# Patient Record
Sex: Male | Born: 1980 | Race: White | Hispanic: No | Marital: Married | Smoking: Never smoker
Health system: Southern US, Community
[De-identification: ages and names within clinical notes are randomized; demographics above are authoritative.]

## PROBLEM LIST (undated history)

## (undated) DIAGNOSIS — N289 Disorder of kidney and ureter, unspecified: Secondary | ICD-10-CM

## (undated) DIAGNOSIS — Z992 Dependence on renal dialysis: Secondary | ICD-10-CM

## (undated) DIAGNOSIS — N186 End stage renal disease: Secondary | ICD-10-CM

## (undated) DIAGNOSIS — I10 Essential (primary) hypertension: Secondary | ICD-10-CM

## (undated) HISTORY — PX: GASTRIC BYPASS: SHX52

---

## 2004-04-05 ENCOUNTER — Ambulatory Visit: Payer: Self-pay | Admitting: Rheumatology

## 2004-04-25 ENCOUNTER — Ambulatory Visit: Payer: Self-pay | Admitting: Unknown Physician Specialty

## 2005-11-03 ENCOUNTER — Other Ambulatory Visit: Payer: Self-pay

## 2005-11-03 ENCOUNTER — Emergency Department: Payer: Self-pay | Admitting: Emergency Medicine

## 2005-11-07 ENCOUNTER — Ambulatory Visit: Payer: Self-pay | Admitting: Family Medicine

## 2005-11-08 ENCOUNTER — Ambulatory Visit: Payer: Self-pay | Admitting: Family Medicine

## 2005-11-27 ENCOUNTER — Ambulatory Visit: Payer: Self-pay | Admitting: Internal Medicine

## 2005-12-02 ENCOUNTER — Ambulatory Visit (HOSPITAL_BASED_OUTPATIENT_CLINIC_OR_DEPARTMENT_OTHER): Admission: RE | Admit: 2005-12-02 | Discharge: 2005-12-02 | Payer: Self-pay | Admitting: Internal Medicine

## 2005-12-08 ENCOUNTER — Ambulatory Visit: Payer: Self-pay | Admitting: Internal Medicine

## 2005-12-11 ENCOUNTER — Ambulatory Visit: Payer: Self-pay | Admitting: Internal Medicine

## 2005-12-19 ENCOUNTER — Ambulatory Visit: Payer: Self-pay | Admitting: Family Medicine

## 2006-01-26 ENCOUNTER — Emergency Department: Payer: Self-pay | Admitting: Emergency Medicine

## 2006-06-27 ENCOUNTER — Ambulatory Visit: Payer: Self-pay | Admitting: Internal Medicine

## 2006-10-28 ENCOUNTER — Emergency Department: Payer: Self-pay | Admitting: Emergency Medicine

## 2007-01-19 ENCOUNTER — Ambulatory Visit: Payer: Self-pay

## 2008-01-10 ENCOUNTER — Emergency Department: Payer: Self-pay | Admitting: Emergency Medicine

## 2009-01-11 ENCOUNTER — Inpatient Hospital Stay: Payer: Self-pay | Admitting: Internal Medicine

## 2009-10-27 ENCOUNTER — Ambulatory Visit: Payer: Self-pay

## 2010-05-04 ENCOUNTER — Emergency Department: Payer: Self-pay | Admitting: Emergency Medicine

## 2010-07-27 ENCOUNTER — Emergency Department: Payer: Self-pay | Admitting: Emergency Medicine

## 2010-08-26 ENCOUNTER — Emergency Department: Payer: Self-pay | Admitting: Internal Medicine

## 2010-08-27 ENCOUNTER — Emergency Department (HOSPITAL_COMMUNITY)
Admission: EM | Admit: 2010-08-27 | Discharge: 2010-08-27 | Disposition: A | Discharge status: Home / Self Care | Payer: Medicare HMO | Attending: Emergency Medicine | Admitting: Emergency Medicine

## 2010-08-27 DIAGNOSIS — M545 Low back pain, unspecified: Secondary | ICD-10-CM | POA: Insufficient documentation

## 2010-08-27 DIAGNOSIS — Z79899 Other long term (current) drug therapy: Secondary | ICD-10-CM | POA: Insufficient documentation

## 2010-09-04 ENCOUNTER — Emergency Department (HOSPITAL_COMMUNITY)
Admission: EM | Admit: 2010-09-04 | Discharge: 2010-09-04 | Disposition: A | Discharge status: Home / Self Care | Payer: Medicare HMO | Attending: Emergency Medicine | Admitting: Emergency Medicine

## 2010-09-04 ENCOUNTER — Emergency Department (HOSPITAL_COMMUNITY): Payer: Medicare HMO

## 2010-09-04 DIAGNOSIS — M25569 Pain in unspecified knee: Secondary | ICD-10-CM | POA: Insufficient documentation

## 2010-09-04 DIAGNOSIS — M1710 Unilateral primary osteoarthritis, unspecified knee: Secondary | ICD-10-CM | POA: Insufficient documentation

## 2010-09-04 DIAGNOSIS — M7989 Other specified soft tissue disorders: Secondary | ICD-10-CM | POA: Insufficient documentation

## 2010-09-04 DIAGNOSIS — M899 Disorder of bone, unspecified: Secondary | ICD-10-CM | POA: Insufficient documentation

## 2011-01-21 ENCOUNTER — Ambulatory Visit: Payer: Self-pay | Admitting: Rheumatology

## 2011-02-13 ENCOUNTER — Other Ambulatory Visit: Payer: Self-pay | Admitting: Nephrology

## 2011-02-14 ENCOUNTER — Observation Stay: Payer: Self-pay | Admitting: Nephrology

## 2012-06-09 ENCOUNTER — Emergency Department: Payer: Self-pay | Admitting: Emergency Medicine

## 2012-06-09 LAB — COMPREHENSIVE METABOLIC PANEL
Alkaline Phosphatase: 281 U/L — ABNORMAL HIGH (ref 50–136)
Anion Gap: 6 — ABNORMAL LOW (ref 7–16)
Bilirubin,Total: 0.3 mg/dL (ref 0.2–1.0)
Calcium, Total: 7.9 mg/dL — ABNORMAL LOW (ref 8.5–10.1)
Co2: 19 mmol/L — ABNORMAL LOW (ref 21–32)
Osmolality: 284 (ref 275–301)
SGOT(AST): 20 U/L (ref 15–37)
Sodium: 139 mmol/L (ref 136–145)
Total Protein: 7.6 g/dL (ref 6.4–8.2)

## 2012-06-09 LAB — CBC WITH DIFFERENTIAL/PLATELET
Basophil %: 0.4 %
Eosinophil %: 7 %
HGB: 11.8 g/dL — ABNORMAL LOW (ref 13.0–18.0)
Lymphocyte #: 1.1 10*3/uL (ref 1.0–3.6)
Lymphocyte %: 14.4 %
MCHC: 32.9 g/dL (ref 32.0–36.0)
Neutrophil #: 5.4 10*3/uL (ref 1.4–6.5)
RBC: 3.92 10*6/uL — ABNORMAL LOW (ref 4.40–5.90)

## 2012-06-09 LAB — URIC ACID: Uric Acid: 8.9 mg/dL — ABNORMAL HIGH (ref 3.5–7.2)

## 2012-06-09 LAB — SEDIMENTATION RATE: Erythrocyte Sed Rate: 47 mm/hr — ABNORMAL HIGH (ref 0–15)

## 2012-08-03 ENCOUNTER — Emergency Department: Payer: Self-pay | Admitting: Emergency Medicine

## 2014-06-12 NOTE — Op Note (Signed)
PATIENT NAME:  BENGIE, CORMICAN MR#:  [id number] DATE OF BIRTH:  15-Aug-1980  DATE OF PROCEDURE:  02/14/2011  PROCEDURE NOTE  SURGEON: Tama High, MD   REASON FOR PROCEDURE:  1. Acute renal failure.  2. Nephrotic range proteinuria.  3. Hematuria.   POSTOPERATIVE DIAGNOSES:   1. Acute renal failure.  2. Nephrotic range proteinuria.  3. Hematuria.   PROCEDURE PERFORMED: Percutaneous ultrasound-guided left renal biopsy.   DESCRIPTION OF PROCEDURE: After obtaining informed consent, the patient was brought down to the ultrasound suite. Subsequently, the left kidney was identified under ultrasound. Left flank was prepped and draped in standard sterile fashion. Local anesthesia was achieved using 1% lidocaine. Subsequently, using an 18-gauge biopsy device and ultrasound guidance, a total of four passes were made into the left kidney. The specimens were submitted to pathology and the pathologist deemed the specimens to be adequate. A very small perinephric hematoma was identified on postbiopsy images.   The patient tolerated the procedure very well. The patient will return to his room for continued observation.   ESTIMATED BLOOD LOSS: Minimal.        COMPLICATIONS: Small perinephric hematoma on postbiopsy images.  ____________________________ Tama High, MD mnl:bjt D: 02/14/2011 11:58:26 ET T: 02/14/2011 12:17:14 ET JOB#: [id number]  cc: Tama High, MD, <Dictator> Venetia Maxon. Elijio Miles, MD Tama High MD ELECTRONICALLY SIGNED 03/12/2011 22:10

## 2015-03-08 ENCOUNTER — Emergency Department
Admission: EM | Admit: 2015-03-08 | Discharge: 2015-03-09 | Disposition: A | Discharge status: Home / Self Care | Payer: Managed Care, Other (non HMO) | Attending: Emergency Medicine | Admitting: Emergency Medicine

## 2015-03-08 ENCOUNTER — Encounter: Payer: Self-pay | Admitting: Emergency Medicine

## 2015-03-08 ENCOUNTER — Emergency Department: Payer: Managed Care, Other (non HMO)

## 2015-03-08 DIAGNOSIS — R079 Chest pain, unspecified: Secondary | ICD-10-CM | POA: Diagnosis present

## 2015-03-08 DIAGNOSIS — J209 Acute bronchitis, unspecified: Secondary | ICD-10-CM

## 2015-03-08 HISTORY — DX: Disorder of kidney and ureter, unspecified: N28.9

## 2015-03-08 LAB — CBC
HEMATOCRIT: 29.4 % — AB (ref 40.0–52.0)
HEMOGLOBIN: 9.4 g/dL — AB (ref 13.0–18.0)
MCH: 27 pg (ref 26.0–34.0)
MCHC: 31.9 g/dL — AB (ref 32.0–36.0)
MCV: 84.8 fL (ref 80.0–100.0)
Platelets: 257 10*3/uL (ref 150–440)
RBC: 3.47 MIL/uL — AB (ref 4.40–5.90)
RDW: 16.1 % — ABNORMAL HIGH (ref 11.5–14.5)
WBC: 8.2 10*3/uL (ref 3.8–10.6)

## 2015-03-08 LAB — BASIC METABOLIC PANEL
ANION GAP: 6 (ref 5–15)
BUN: 59 mg/dL — ABNORMAL HIGH (ref 6–20)
CALCIUM: 7.1 mg/dL — AB (ref 8.9–10.3)
CHLORIDE: 108 mmol/L (ref 101–111)
CO2: 18 mmol/L — AB (ref 22–32)
Creatinine, Ser: 4.42 mg/dL — ABNORMAL HIGH (ref 0.61–1.24)
GFR calc Af Amer: 18 mL/min — ABNORMAL LOW (ref >60)
GFR calc non Af Amer: 16 mL/min — ABNORMAL LOW (ref >60)
GLUCOSE: 105 mg/dL — AB (ref 65–99)
POTASSIUM: 4.7 mmol/L (ref 3.5–5.1)
Sodium: 132 mmol/L — ABNORMAL LOW (ref 135–145)

## 2015-03-08 LAB — TROPONIN I: Troponin I: <0.03 ng/mL (ref <0.031)

## 2015-03-08 MED ORDER — LEVOFLOXACIN IN D5W 750 MG/150ML IV SOLN
750.0000 mg | Freq: Once | INTRAVENOUS | Status: AC
  Administered 2015-03-09: 750 mg via INTRAVENOUS
  Filled 2015-03-08: qty 150

## 2015-03-08 MED ORDER — SODIUM CHLORIDE 0.9 % IV BOLUS (SEPSIS)
1000.0000 mL | Freq: Once | INTRAVENOUS | Status: AC
  Administered 2015-03-09: 1000 mL via INTRAVENOUS

## 2015-03-08 NOTE — ED Notes (Signed)
Pt was at work at [employer] at [employer] today, when he suddenly began to have chest pressure in the center of his chest, that he describes as feeling as though an elephant was sitting on his chest.  Other symptoms include diaphoresis, nausea, lightheadedness, dizziness.  Episode lasted approximately 45 minutes and subsided after he laid down.

## 2015-03-08 NOTE — ED Notes (Signed)
Pt reports chills and "heavy" Cp starting today. Pt reports hx of kidney diease. Pt currently denies CP. Pt in no acute distress

## 2015-03-08 NOTE — ED Provider Notes (Signed)
[facility] Emergency Department Provider Note  ____________________________________________  Time seen: 11:45 PM  I have reviewed the triage vital signs and the nursing notes.   HISTORY  Chief Complaint Chest Pain and Fever     HPI Louis Lee is a 35 y.o. male presents with chills and generalized myalgias nausea and chest discomfort which subsided when the patient laid down. 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 Patient temperature on presentation was 100.4. Patient denies any cough     Past Medical History  Diagnosis Date  . Renal disorder     pt is listed for kidney transplant    There are no active problems to display for this patient.   Past Surgical History  Procedure Laterality Date  . Gastric bypass      No current outpatient prescriptions on file.  Allergies Review of patient's allergies indicates no known allergies.  No family history on file.  Social History Social History  Substance Use Topics  . Smoking status: Never Smoker   . Smokeless tobacco: None  . Alcohol Use: No    Review of Systems  Constitutional: Positive for chills and fever Eyes: Negative for visual changes. ENT: Negative for sore throat. Cardiovascular: Negative for chest pain. Respiratory: Negative for shortness of breath. Gastrointestinal: Negative for abdominal pain, vomiting and diarrhea. Genitourinary: Negative for dysuria. Musculoskeletal: Negative for back pain. Skin: Negative for rash. Neurological: Negative for headaches, focal weakness or numbness.   10-point ROS otherwise negative.  ____________________________________________   PHYSICAL EXAM:  VITAL SIGNS: ED Triage Vitals  Enc Vitals Group     BP 03/08/15 1902 140/77 mmHg     Pulse Rate 03/08/15 1902 96     Resp 03/08/15 1902 18     Temp 03/08/15 1902 100.4 F (38 C)     Temp Source 03/08/15 1902 Oral     SpO2 03/08/15 1902 100 %      Weight 03/08/15 1902 190 lb (86.183 kg)     Height 03/08/15 1902 5\' 3"  (1.6 m)     Head Cir --      Peak Flow --      Pain Score 03/08/15 1908 7     Pain Loc --      Pain Edu? --      Excl. in [place]? --      Constitutional: Alert and oriented. Well appearing and in no distress. Eyes: Conjunctivae are normal. PERRL. Normal extraocular movements. ENT   Head: Normocephalic and atraumatic.   Nose: No congestion/rhinnorhea.   Mouth/Throat: Mucous membranes are moist.   Neck: No stridor. Hematological/Lymphatic/Immunilogical: No cervical lymphadenopathy. Cardiovascular: Normal rate, regular rhythm. Normal and symmetric distal pulses are present in all extremities. No murmurs, rubs, or gallops. Respiratory: Normal respiratory effort without tachypnea nor retractions. Breath sounds are clear and equal bilaterally. No wheezes/rales/rhonchi. Gastrointestinal: Soft and nontender. No distention. There is no CVA tenderness. Genitourinary: deferred Musculoskeletal: Nontender with normal range of motion in all extremities. No joint effusions.  No lower extremity tenderness nor edema. Neurologic:  Normal speech and language. No gross focal neurologic deficits are appreciated. Speech is normal.  Skin:  Skin is warm, dry and intact. No rash noted. Psychiatric: Mood and affect are normal. Speech and behavior are normal. Patient exhibits appropriate insight and judgment.  ____________________________________________    LABS (pertinent positives/negatives)  Labs Reviewed  BASIC METABOLIC PANEL - Abnormal; Notable for the following:    Sodium 132 (*)  CO2 18 (*)    Glucose, Bld 105 (*)    BUN 59 (*)    Creatinine, Ser 4.42 (*)    Calcium 7.1 (*)    GFR calc non Af Amer 16 (*)    GFR calc Af Amer 18 (*)    All other components within normal limits  CBC - Abnormal; Notable for the following:    RBC 3.47 (*)    Hemoglobin 9.4 (*)    HCT 29.4 (*)    MCHC 31.9 (*)    RDW 16.1 (*)     All other components within normal limits  TROPONIN I     ____________________________________________   EKG  ED ECG REPORT I, Judas Mohammad, [place], the attending physician, personally viewed and interpreted this ECG.   Date: 03/08/2015  EKG Time: 6:57 PM  Rate: 98  Rhythm: Normal sinus rhythm  Axis: None  Intervals: Normal  ST&T Change: None   ____________________________________________    RADIOLOGY     DG Chest 2 View (Final result) Result time: 03/08/15 19:39:48   Final result by Rad Results In Interface (03/08/15 19:39:48)   Narrative:   CLINICAL DATA: 35 year old male with chest pressure and pain, diaphoresis, nausea, lightheadedness and dizziness.  EXAM: CHEST 2 VIEW  COMPARISON: Chest x-ray 01/11/2009.  FINDINGS: Mild diffuse peribronchial cuffing. Lung volumes are normal. No consolidative airspace disease. No pleural effusions. No pneumothorax. No pulmonary nodule or mass noted. Pulmonary vasculature and the cardiomediastinal silhouette are within normal limits.  IMPRESSION: 1. Mild diffuse peribronchial cuffing, which could be seen in the setting of an acute or chronic bronchitis.   Electronically Signed By: Vinnie Langton M.D. On: 03/08/2015 19:39          INITIAL IMPRESSION / ASSESSMENT AND PLAN / ED COURSE  Pertinent labs & imaging results that were available during my care of the patient were reviewed by me and considered in my medical decision making (see chart for details).  Given immunocompromise state patient received Levaquin for acute bronchitis and will be prescribed same at home.  ____________________________________________   FINAL CLINICAL IMPRESSION(S) / ED DIAGNOSES  Final diagnoses:  Acute bronchitis, unspecified organism      Gregor Hams, MD 03/09/15 [phone]

## 2015-03-09 LAB — TROPONIN I

## 2015-03-09 MED ORDER — LEVOFLOXACIN 500 MG PO TABS: 500.0000 mg | ORAL_TABLET | Freq: Every day | ORAL | Status: AC

## 2015-03-09 NOTE — Discharge Instructions (Signed)

## 2015-03-16 IMAGING — CR RIGHT HAND - COMPLETE 3+ VIEW
1 series · 3 of 3 positions shown · non-contrast
Comparison: none

REASON FOR EXAM: pain, swelling
COMMENTS:

PROCEDURE:     DXR - DXR HAND RT COMPLETE W/OBLIQUES  - June 09, 2012  [DATE]
RESULT:     Right hand images demonstrate old healed mid fifth metacarpal
fracture. No acute bony abnormality is evident. No severe degenerative
change is demonstrated.

[Series 1: pa · 0.17mm/px · 3 of 3 slices shown]
[im 1/3]
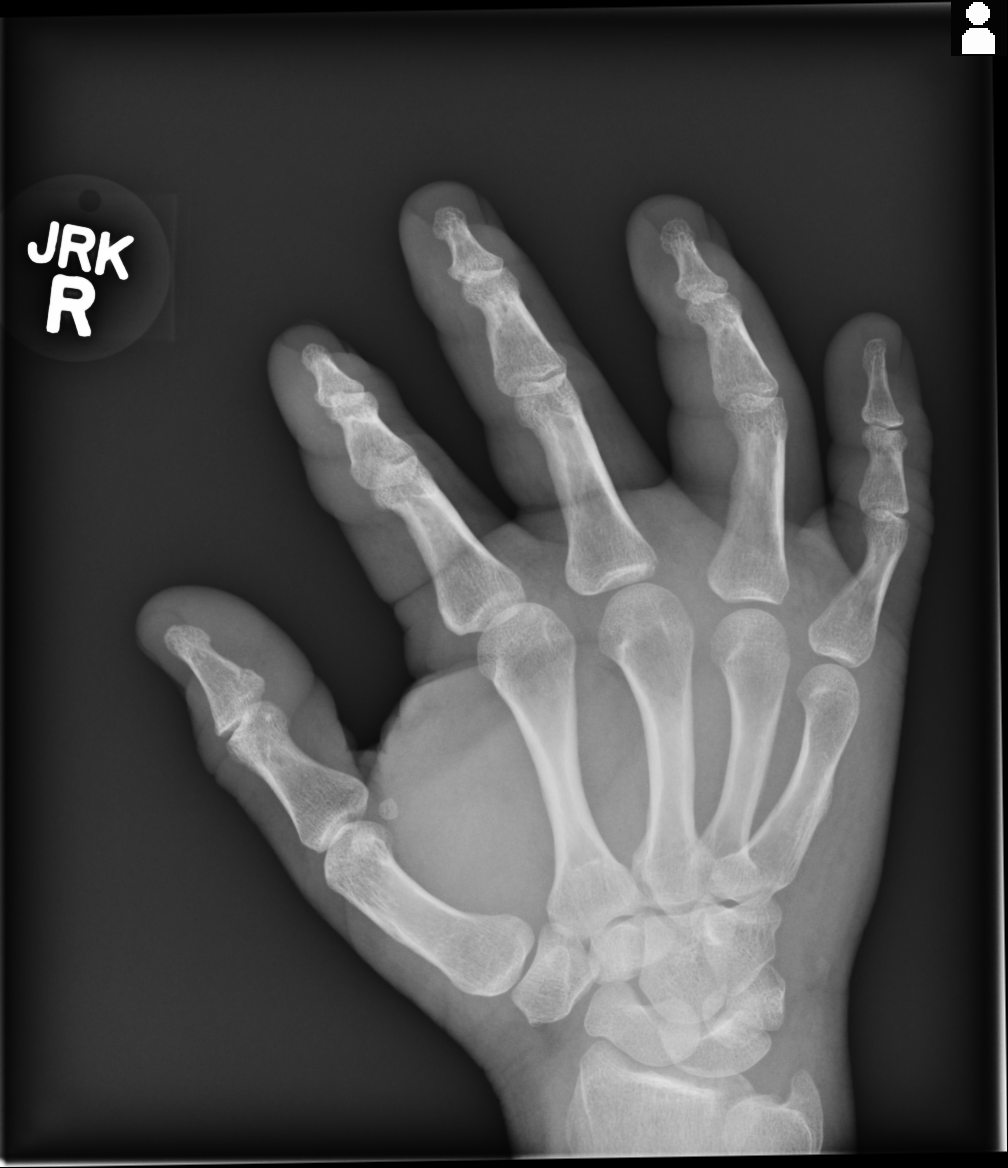
[im 2/3]
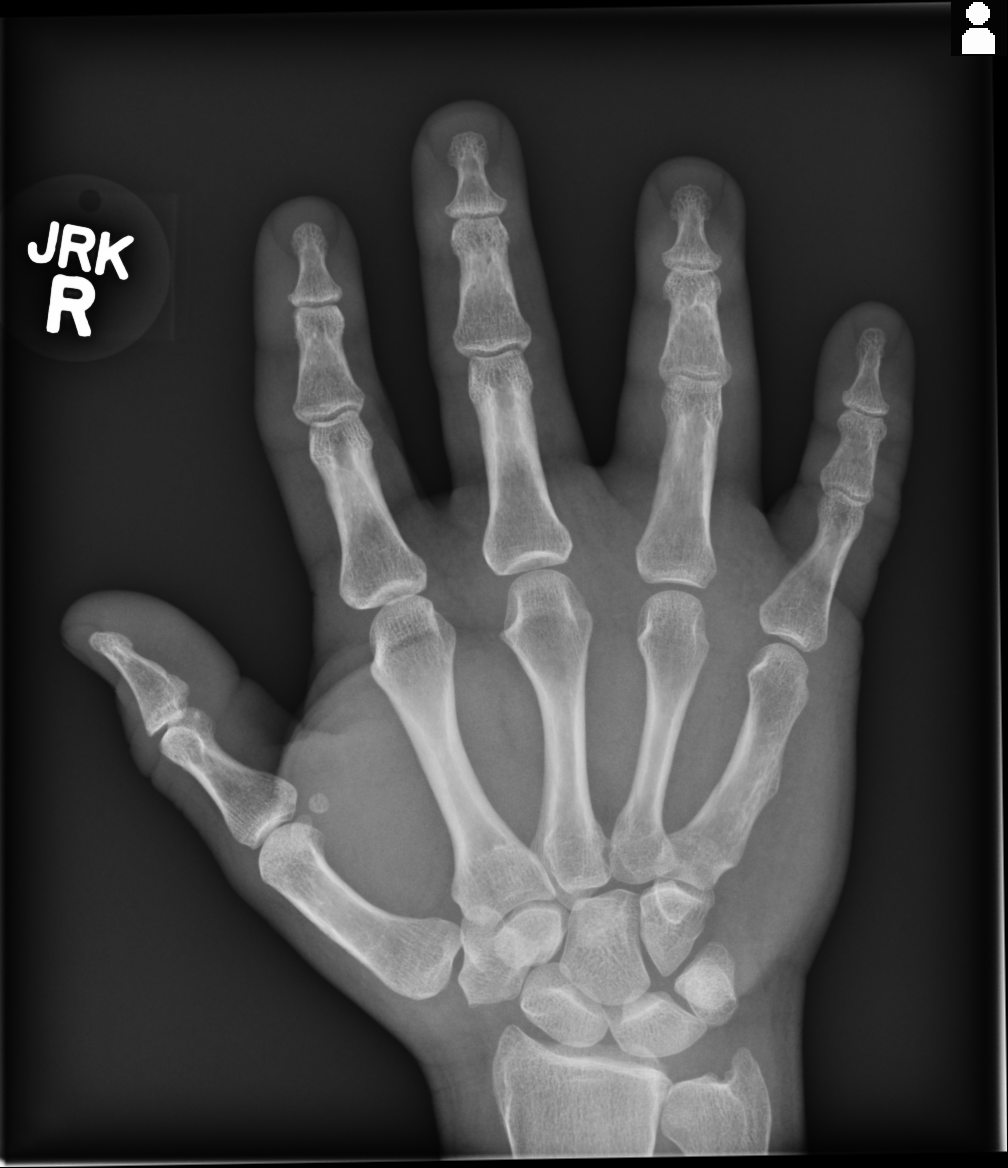
[im 3/3]
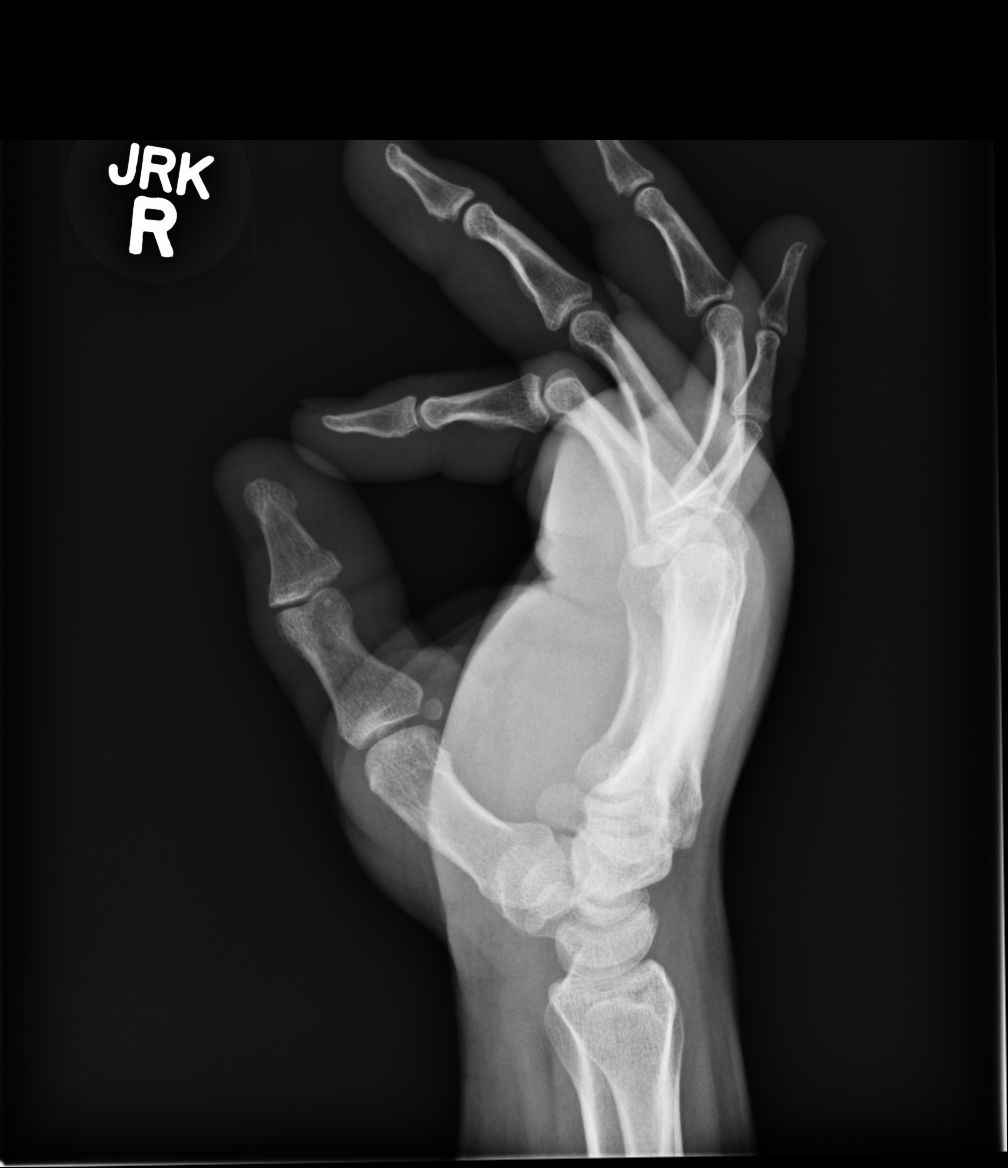

[3 of 3 positions shown; findings below may reference images not displayed]

IMPRESSION: 1. No acute bony abnormality. Irregular contour of the mid right fifth
metacarpal consistent with an old healed fracture. Correlate with history.

[REDACTED]

## 2016-01-29 ENCOUNTER — Encounter: Payer: Self-pay | Admitting: Emergency Medicine

## 2016-01-29 ENCOUNTER — Inpatient Hospital Stay
Admission: EM | Admit: 2016-01-29 | Discharge: 2016-01-31 | DRG: 193 | Disposition: A | Discharge status: Home / Self Care | Payer: Managed Care, Other (non HMO) | Attending: Internal Medicine | Admitting: Internal Medicine

## 2016-01-29 ENCOUNTER — Emergency Department: Payer: Managed Care, Other (non HMO)

## 2016-01-29 DIAGNOSIS — J209 Acute bronchitis, unspecified: Secondary | ICD-10-CM | POA: Diagnosis present

## 2016-01-29 DIAGNOSIS — N186 End stage renal disease: Secondary | ICD-10-CM | POA: Diagnosis present

## 2016-01-29 DIAGNOSIS — D649 Anemia, unspecified: Secondary | ICD-10-CM

## 2016-01-29 DIAGNOSIS — Z9884 Bariatric surgery status: Secondary | ICD-10-CM

## 2016-01-29 DIAGNOSIS — Z992 Dependence on renal dialysis: Secondary | ICD-10-CM | POA: Diagnosis not present

## 2016-01-29 DIAGNOSIS — E875 Hyperkalemia: Secondary | ICD-10-CM | POA: Diagnosis present

## 2016-01-29 DIAGNOSIS — Z7682 Awaiting organ transplant status: Secondary | ICD-10-CM | POA: Diagnosis not present

## 2016-01-29 DIAGNOSIS — J189 Pneumonia, unspecified organism: Secondary | ICD-10-CM | POA: Diagnosis not present

## 2016-01-29 DIAGNOSIS — Z8711 Personal history of peptic ulcer disease: Secondary | ICD-10-CM

## 2016-01-29 DIAGNOSIS — R000 Tachycardia, unspecified: Secondary | ICD-10-CM | POA: Diagnosis present

## 2016-01-29 DIAGNOSIS — I120 Hypertensive chronic kidney disease with stage 5 chronic kidney disease or end stage renal disease: Secondary | ICD-10-CM | POA: Diagnosis present

## 2016-01-29 DIAGNOSIS — R0603 Acute respiratory distress: Secondary | ICD-10-CM | POA: Diagnosis present

## 2016-01-29 DIAGNOSIS — R7989 Other specified abnormal findings of blood chemistry: Secondary | ICD-10-CM

## 2016-01-29 DIAGNOSIS — R778 Other specified abnormalities of plasma proteins: Secondary | ICD-10-CM

## 2016-01-29 DIAGNOSIS — D631 Anemia in chronic kidney disease: Secondary | ICD-10-CM | POA: Diagnosis present

## 2016-01-29 DIAGNOSIS — Z6834 Body mass index (BMI) 34.0-34.9, adult: Secondary | ICD-10-CM

## 2016-01-29 DIAGNOSIS — R0602 Shortness of breath: Secondary | ICD-10-CM | POA: Diagnosis present

## 2016-01-29 DIAGNOSIS — G4733 Obstructive sleep apnea (adult) (pediatric): Secondary | ICD-10-CM | POA: Diagnosis present

## 2016-01-29 LAB — COMPREHENSIVE METABOLIC PANEL
ALBUMIN: 2.8 g/dL — AB (ref 3.5–5.0)
ALT: 17 U/L (ref 17–63)
ANION GAP: 18 — AB (ref 5–15)
AST: 17 U/L (ref 15–41)
Alkaline Phosphatase: 75 U/L (ref 38–126)
BUN: 109 mg/dL — ABNORMAL HIGH (ref 6–20)
CHLORIDE: 101 mmol/L (ref 101–111)
CO2: 17 mmol/L — AB (ref 22–32)
Calcium: 8 mg/dL — ABNORMAL LOW (ref 8.9–10.3)
Creatinine, Ser: 15.17 mg/dL — ABNORMAL HIGH (ref 0.61–1.24)
GFR calc non Af Amer: 4 mL/min — ABNORMAL LOW (ref >60)
GFR, EST AFRICAN AMERICAN: 4 mL/min — AB (ref >60)
GLUCOSE: 87 mg/dL (ref 65–99)
POTASSIUM: 6.1 mmol/L — AB (ref 3.5–5.1)
SODIUM: 136 mmol/L (ref 135–145)
Total Bilirubin: 0.8 mg/dL (ref 0.3–1.2)
Total Protein: 7.1 g/dL (ref 6.5–8.1)

## 2016-01-29 LAB — CBC
HCT: 23.8 % — ABNORMAL LOW (ref 40.0–52.0)
HEMOGLOBIN: 7.9 g/dL — AB (ref 13.0–18.0)
MCH: 28.1 pg (ref 26.0–34.0)
MCHC: 33.1 g/dL (ref 32.0–36.0)
MCV: 84.8 fL (ref 80.0–100.0)
PLATELETS: 263 10*3/uL (ref 150–440)
RBC: 2.8 MIL/uL — ABNORMAL LOW (ref 4.40–5.90)
RDW: 18.8 % — AB (ref 11.5–14.5)
WBC: 7.4 10*3/uL (ref 3.8–10.6)

## 2016-01-29 LAB — BRAIN NATRIURETIC PEPTIDE: B NATRIURETIC PEPTIDE 5: 1283 pg/mL — AB (ref 0.0–100.0)

## 2016-01-29 LAB — INFLUENZA PANEL BY PCR (TYPE A & B)
INFLBPCR: NEGATIVE
Influenza A By PCR: NEGATIVE

## 2016-01-29 LAB — TROPONIN I: TROPONIN I: 0.12 ng/mL — AB (ref <0.03)

## 2016-01-29 MED ORDER — LEVOFLOXACIN IN D5W 750 MG/150ML IV SOLN
INTRAVENOUS | Status: AC
  Administered 2016-01-29: 750 mg via INTRAVENOUS
  Filled 2016-01-29: qty 150

## 2016-01-29 MED ORDER — HYDRALAZINE HCL 20 MG/ML IJ SOLN
5.0000 mg | Freq: Once | INTRAMUSCULAR | Status: AC
  Administered 2016-01-29: 5 mg via INTRAVENOUS
  Filled 2016-01-29: qty 1

## 2016-01-29 MED ORDER — HEPARIN 1000 UNIT/ML FOR PERITONEAL DIALYSIS
500.0000 [IU] | INTRAMUSCULAR | Status: DC | PRN
  Filled 2016-01-29: qty 0.5

## 2016-01-29 MED ORDER — IPRATROPIUM-ALBUTEROL 0.5-2.5 (3) MG/3ML IN SOLN
3.0000 mL | Freq: Once | RESPIRATORY_TRACT | Status: AC
  Administered 2016-01-29: 3 mL via RESPIRATORY_TRACT
  Filled 2016-01-29 (×2): qty 3

## 2016-01-29 MED ORDER — DELFLEX-LC/2.5% DEXTROSE 394 MOSM/L IP SOLN
INTRAPERITONEAL | Status: DC
  Administered 2016-01-30: 01:00:00 via INTRAPERITONEAL
  Filled 2016-01-29 (×2): qty 3000

## 2016-01-29 MED ORDER — HYDROCOD POLST-CPM POLST ER 10-8 MG/5ML PO SUER
5.0000 mL | Freq: Once | ORAL | Status: AC
  Administered 2016-01-29: 5 mL via ORAL

## 2016-01-29 MED ORDER — ASPIRIN 81 MG PO CHEW
324.0000 mg | CHEWABLE_TABLET | Freq: Once | ORAL | Status: AC
  Administered 2016-01-29: 324 mg via ORAL

## 2016-01-29 MED ORDER — HYDROCOD POLST-CPM POLST ER 10-8 MG/5ML PO SUER
ORAL | Status: AC
  Administered 2016-01-29: 5 mL via ORAL
  Filled 2016-01-29: qty 5

## 2016-01-29 MED ORDER — GENTAMICIN SULFATE 0.1 % EX CREA
1.0000 "application " | TOPICAL_CREAM | Freq: Every day | CUTANEOUS | Status: DC
  Administered 2016-01-30: 1 via TOPICAL
  Filled 2016-01-29: qty 15

## 2016-01-29 MED ORDER — ASPIRIN 81 MG PO CHEW
CHEWABLE_TABLET | ORAL | Status: AC
  Administered 2016-01-29: 324 mg via ORAL
  Filled 2016-01-29: qty 4

## 2016-01-29 MED ORDER — LEVOFLOXACIN IN D5W 750 MG/150ML IV SOLN
750.0000 mg | Freq: Once | INTRAVENOUS | Status: AC
  Administered 2016-01-29: 750 mg via INTRAVENOUS
  Filled 2016-01-29: qty 150

## 2016-01-29 NOTE — ED Provider Notes (Addendum)
[facility] Emergency Department Provider Note        Time seen: ----------------------------------------- 6:27 PM on 01/29/2016 -----------------------------------------    I have reviewed the triage vital signs and the nursing notes.   HISTORY  Chief Complaint Generalized Body Aches and Nasal Congestion    HPI Louis Lee is a 35 y.o. male who presents to ER for body aches, chills, congestion and cough. Patient states symptoms started on Saturday and he has sore throat as well after coughing. Patient is on peritoneal dialysis and kidney transplant list.Patient states he is coughing up brown sputum, nothing makes his symptoms better.   Past Medical History:  Diagnosis Date  . Renal disorder    pt is listed for kidney transplant    There are no active problems to display for this patient.   Past Surgical History:  Procedure Laterality Date  . GASTRIC BYPASS      Allergies Patient has no known allergies.  Social History Social History  Substance Use Topics  . Smoking status: Never Smoker  . Smokeless tobacco: Never Used  . Alcohol use No    Review of Systems Constitutional: Positive for aches and chills, sore throat Cardiovascular: Negative for chest pain. Respiratory: Negative for shortness of breath.Positive for cough Gastrointestinal: Negative for abdominal pain, vomiting and diarrhea. Genitourinary: Negative for dysuria. Musculoskeletal: Negative for back pain. Skin: Negative for rash. Neurological: Negative for headaches, focal weakness or numbness.  10-point ROS otherwise negative.  ____________________________________________   PHYSICAL EXAM:  VITAL SIGNS: ED Triage Vitals  Enc Vitals Group     BP 01/29/16 1748 (!) 187/117     Pulse Rate 01/29/16 1748 81     Resp 01/29/16 1748 18     Temp 01/29/16 1748 97.7 F (36.5 C)     Temp Source 01/29/16 1748 Oral     SpO2 01/29/16 1748 96 %     Weight 01/29/16 1749 209  lb (94.8 kg)     Height 01/29/16 1749 5\' 4"  (1.626 m)     Head Circumference --      Peak Flow --      Pain Score 01/29/16 1748 5     Pain Loc --      Pain Edu? --      Excl. in [place]? --     Constitutional: Alert and oriented. Chronically ill-appearing, in no distress Eyes: Conjunctivae are pale PERRL. Normal extraocular movements. ENT   Head: Normocephalic and atraumatic.   Nose: No congestion/rhinnorhea.   Mouth/Throat: Mucous membranes are moist.   Neck: No stridor. Cardiovascular: Normal rate, regular rhythm. No murmurs, rubs, or gallops. Respiratory: Rhonchi in the right base, otherwise clear Gastrointestinal: Soft and nontender. Normal bowel sounds Musculoskeletal: Nontender with normal range of motion in all extremities. No lower extremity tenderness nor edema. Neurologic:  Normal speech and language. No gross focal neurologic deficits are appreciated.  Skin:  Skin is warm, dry and intact. No rash noted. Psychiatric: Mood and affect are normal. Speech and behavior are normal.  ____________________________________________  EKG: Interpreted by me.Sinus rhythm rate of 92 bpm, normal PR interval, normal QRS, normal QT, normal axis. Non specific T-wave changes  ____________________________________________  ED COURSE:  Pertinent labs & imaging results that were available during my care of the patient were reviewed by me and considered in my medical decision making (see chart for details). Clinical Course as of Jan 31 1450  Mon Jan 29, 2016  2012 Patient was discussed with both nephrology and hospitalist  service.  [JW]    Clinical Course User Index [JW] Earleen Newport, MD  Patient is in no distress, we will assess with labs and imaging. He'll receive a DuoNeb and Tussionex as well.  Procedures ____________________________________________   LABS (pertinent positives/negatives)  Labs Reviewed  CBC - Abnormal; Notable for the following:       Result Value    RBC 2.80 (*)    Hemoglobin 7.9 (*)    HCT 23.8 (*)    RDW 18.8 (*)    All other components within normal limits  COMPREHENSIVE METABOLIC PANEL - Abnormal; Notable for the following:    Potassium 6.1 (*)    CO2 17 (*)    BUN 109 (*)    Creatinine, Ser 15.17 (*)    Calcium 8.0 (*)    Albumin 2.8 (*)    GFR calc non Af Amer 4 (*)    GFR calc Af Amer 4 (*)    Anion gap 18 (*)    All other components within normal limits  TROPONIN I - Abnormal; Notable for the following:    Troponin I 0.12 (*)    All other components within normal limits  CULTURE, BLOOD (ROUTINE X 2)  CULTURE, BLOOD (ROUTINE X 2)  INFLUENZA PANEL BY PCR (TYPE A & B, H1N1)    RADIOLOGY Images were viewed by me  Chest x-ray IMPRESSION: Patchy infiltrates bilaterally slightly worse on the right than the left. ____________________________________________  FINAL ASSESSMENT AND PLAN  Cough, Pneumonia, chronic kidney disease, elevated troponin, hyperkalemia  Plan: Patient with labs and imaging as dictated above. Patient presents to ER with symptoms of influenza or pneumonia. X-ray findings confirmed pneumonia. Patient has instructed that he is chronically hyperkalemic but that it improves rapidly with peritoneal dialysis. His troponin is elevated which is unusual for him, we will give him aspirin and this will need to be repeated. His pneumonia will be treated with IV Levaquin. I will discuss with the hospitalist for admission.   Earleen Newport, MD   Note: This dictation was prepared with Dragon dictation. Any transcriptional errors that result from this process are unintentional    Earleen Newport, MD 01/29/16 2002    Earleen Newport, MD 02/01/16 [phone]

## 2016-01-29 NOTE — ED Triage Notes (Addendum)
Patient presents to the ED with body aches, chills, nasal congestion, and cough.  Patient states symptoms started on Saturday.  Patient states, "my throat hurts sometimes after I cough a lot."  Denies any other family members with similar symptoms.  Patient is on peritoneal dialysis.  Patient is on kidney transplant list.

## 2016-01-29 NOTE — Progress Notes (Signed)
Orders placed for [facility] at 2041pm. Awaiting patient arrival to room so that he can be started on [facility]. Please call dialysis unit when arrives.

## 2016-01-29 NOTE — ED Notes (Signed)
Patient transported to X-ray 

## 2016-01-29 NOTE — ED Notes (Signed)
Pt reports has not been able to take blood pressure medicine for the past 2 days.

## 2016-01-29 NOTE — H&P (Signed)
[place] [facility] [place] NAME: Louis Lee    MR#:  [id number]  DATE OF BIRTH:  Jun 24, 1980  DATE OF ADMISSION:  01/29/2016  PRIMARY CARE PHYSICIAN: [facility]   REQUESTING/REFERRING PHYSICIAN: Dr. Jimmye Norman  CHIEF COMPLAINT:  Shortness of breath cough congestion with productive phlegm brownish sputum  HISTORY OF PRESENT ILLNESS:  Louis Lee  is a 35 y.o. male with a known history of End-stage renal disease on peritoneal dialysis, hypertension, history of gastric bypass, comes to the emergency room with increasing shortness of breath cough congestion. Patient states his symptoms started with URI and lasts 1-2 days has been coughing up brownish phlegm. Denies any chest pain. Has been having some shortness of breath more so with exertion. In the emergency room his been afebrile. Sats were in the upper 90s. Mildly tachycardic. Chest x-ray shows bilateral infiltrate worse his edema. Received 1 dose of IV Levaquin. She was also noted to have potassium elevated to 6 without any EKG changes. He's been admitted with acute respiratory failure secondary to shortness of breath/pulmonary edema worse his pneumonia.  PAST MEDICAL HISTORY:   Past Medical History:  Diagnosis Date  . Renal disorder    pt is listed for kidney transplant    PAST SURGICAL HISTOIRY:   Past Surgical History:  Procedure Laterality Date  . GASTRIC BYPASS      SOCIAL HISTORY:   Social History  Substance Use Topics  . Smoking status: Never Smoker  . Smokeless tobacco: Never Used  . Alcohol use No    FAMILY HISTORY:  No family history on file.  DRUG ALLERGIES:  No Known Allergies  REVIEW OF SYSTEMS:  Review of Systems  Constitutional: Positive for malaise/fatigue. Negative for chills, fever and weight loss.  HENT: Positive for congestion and sore throat. Negative for ear discharge, ear pain and nosebleeds.   Eyes: Negative for blurred vision, pain  and discharge.  Respiratory: Positive for cough, sputum production and shortness of breath. Negative for wheezing and stridor.   Cardiovascular: Negative for chest pain, palpitations, orthopnea and PND.  Gastrointestinal: Negative for abdominal pain, diarrhea, nausea and vomiting.  Genitourinary: Negative for frequency and urgency.  Musculoskeletal: Negative for back pain and joint pain.  Neurological: Positive for weakness. Negative for sensory change, speech change and focal weakness.  Psychiatric/Behavioral: Negative for depression and hallucinations. The patient is not nervous/anxious.      MEDICATIONS AT HOME:   Prior to Admission medications   Medication Sig Start Date End Date Taking? Authorizing Provider  albuterol (PROVENTIL HFA;VENTOLIN HFA) 108 (90 Base) MCG/ACT inhaler Inhale 2 puffs into the lungs every 6 (six) hours as needed. 03/26/13  Yes Historical Provider, MD  amLODipine (NORVASC) 5 MG tablet Take 1 tablet by mouth daily. 12/18/15  Yes Historical Provider, MD  calcium acetate (PHOSLO) 667 MG capsule Take 1,334 mg by mouth 3 (three) times daily with meals. Take 2 tablets (1,'337mg'$ ) with each meal and between meals for total of 3 times per day. 12/21/15  Yes Historical Provider, MD  furosemide (LASIX) 40 MG tablet Take 1 tablet by mouth daily. 10/18/15  Yes Historical Provider, MD  losartan (COZAAR) 100 MG tablet Take 100 mg by mouth daily. 01/03/16 01/02/17 Yes Historical Provider, MD  Oxycodone HCl 10 MG TABS Take 10 mg by mouth every 8 (eight) hours as needed. 06/28/15  Yes Historical Provider, MD  sodium bicarbonate 650 MG tablet Take 650 mg by mouth 2 (two) times daily. 12/06/15  Yes Historical Provider, MD  Vitamin D, Ergocalciferol, (DRISDOL) 50000 units CAPS capsule Take 1 capsule by mouth once a week. wed 12/18/15  Yes Historical Provider, MD  febuxostat (ULORIC) 40 MG tablet Take 1 tablet by mouth daily. 07/20/15   Historical Provider, MD      VITAL SIGNS:  Blood  pressure (!) 178/114, pulse 81, temperature 97.7 F (36.5 C), temperature source Oral, resp. rate 18, height '5\' 4"'$  (1.626 m), weight 94.8 kg (209 lb), SpO2 96 %.  PHYSICAL EXAMINATION:  GENERAL:  35 y.o.-year-old patient lying in the bed with no acute distress.  EYES: Pupils equal, round, reactive to light and accommodation. No scleral icterus. Extraocular muscles intact.  HEENT: Head atraumatic, normocephalic. Oropharynx and nasopharynx clear.  NECK:  Supple, no jugular venous distention. No thyroid enlargement, no tenderness.  LUNGS: Normal breath sounds bilaterally, no wheezing, rales,rhonchi or crepitation. No use of accessory muscles of respiration.  CARDIOVASCULAR: S1, S2 normal. No murmurs, rubs, or gallops.  ABDOMEN: Soft, nontender, nondistended. Bowel sounds present. No organomegaly or mass.  EXTREMITIES: No pedal edema, cyanosis, or clubbing.  NEUROLOGIC: Cranial nerves II through XII are intact. Muscle strength 5/5 in all extremities. Sensation intact. Gait not checked.  PSYCHIATRIC: The patient is alert and oriented x 3.  SKIN: No obvious rash, lesion, or ulcer.   LABORATORY PANEL:   CBC  Recent Labs Lab 01/29/16 1801  WBC 7.4  HGB 7.9*  HCT 23.8*  PLT 263   ------------------------------------------------------------------------------------------------------------------  Chemistries   Recent Labs Lab 01/29/16 1801  NA 136  K 6.1*  CL 101  CO2 17*  GLUCOSE 87  BUN 109*  CREATININE 15.17*  CALCIUM 8.0*  AST 17  ALT 17  ALKPHOS 75  BILITOT 0.8   ------------------------------------------------------------------------------------------------------------------  Cardiac Enzymes  Recent Labs Lab 01/29/16 1801  TROPONINI 0.12*   ------------------------------------------------------------------------------------------------------------------  RADIOLOGY:  Dg Chest 2 View  Result Date: 01/29/2016 CLINICAL DATA:  Cough and chills for 2 days EXAM:  CHEST  2 VIEW COMPARISON:  03/08/2015 FINDINGS: Cardiac shadow is at the upper limits of normal in size. The lungs are well aerated bilaterally and demonstrate some patchy infiltrative changes bilaterally. No sizable effusion is seen. No acute bony abnormality is noted. IMPRESSION: Patchy infiltrates bilaterally slightly worse on the right than the left. Electronically Signed   By: Inez Catalina M.D.   On: 01/29/2016 18:54    EKG:   Normal sinus rhythm. No peak T waves. IMPRESSION AND PLAN:   Louis Lee  is a 35 y.o. male with a known history of End-stage renal disease on peritoneal dialysis, hypertension, history of gastric bypass, comes to the emergency room with increasing shortness of breath cough congestion. Patient states his symptoms started with URI and lasts 1-2 days has been coughing up brownish phlegm. Denies any chest pain. Has been having some shortness of breath more so with exertion.  1.Acute respiratory distress with shortness of breath URI congestion with possible bilateral interstitial edema versus pneumonia -Admit to medical floor -Oral Levaquin -When necessary nebs and oral inhalers -Patient sats are 96% on room air currently  2. End stage renal disease on peritoneal dialysis -Patient's peritoneal dialysis orders have been given by dr Candiss Norse -Potassium elevated at 6 -Repeat met B in the morning  3. Malignant hypertension  Resume home meds  4. DVT prophylaxis subcutaneous heparin  No family members present discussed with nephrology  All the records are reviewed and case discussed with ED provider. Management plans discussed with the patient,  family and they are in agreement.  CODE STATUS: full  TOTAL TIME TAKING CARE OF THIS PATIENT:50 minutes.    Louis Lee M.D on 01/29/2016 at 9:12 PM  Between 7am to 6pm - Pager - [phone]  After 6pm go to [email or web address] - password [email or web address] [place] [facility]  Office  [phone]  CC: Primary care  physician; [facility]

## 2016-01-29 NOTE — ED Notes (Signed)
Pt transferred to room 13 with family; awaiting inpatient room assignment

## 2016-01-30 LAB — PROTIME-INR
INR: 1.19
Prothrombin Time: 15.2 seconds (ref 11.4–15.2)

## 2016-01-30 LAB — BASIC METABOLIC PANEL
ANION GAP: 17 — AB (ref 5–15)
BUN: 113 mg/dL — ABNORMAL HIGH (ref 6–20)
CALCIUM: 8 mg/dL — AB (ref 8.9–10.3)
CHLORIDE: 101 mmol/L (ref 101–111)
CO2: 17 mmol/L — AB (ref 22–32)
Creatinine, Ser: 14.86 mg/dL — ABNORMAL HIGH (ref 0.61–1.24)
GFR calc non Af Amer: 4 mL/min — ABNORMAL LOW (ref >60)
GFR, EST AFRICAN AMERICAN: 4 mL/min — AB (ref >60)
GLUCOSE: 102 mg/dL — AB (ref 65–99)
POTASSIUM: 5.3 mmol/L — AB (ref 3.5–5.1)
Sodium: 135 mmol/L (ref 135–145)

## 2016-01-30 LAB — CBC
HEMATOCRIT: 24 % — AB (ref 40.0–52.0)
Hemoglobin: 7.9 g/dL — ABNORMAL LOW (ref 13.0–18.0)
MCH: 27.9 pg (ref 26.0–34.0)
MCHC: 32.8 g/dL (ref 32.0–36.0)
MCV: 84.9 fL (ref 80.0–100.0)
Platelets: 288 10*3/uL (ref 150–440)
RBC: 2.82 MIL/uL — AB (ref 4.40–5.90)
RDW: 18.8 % — ABNORMAL HIGH (ref 11.5–14.5)
WBC: 5.7 10*3/uL (ref 3.8–10.6)

## 2016-01-30 LAB — APTT: aPTT: 32 seconds (ref 24–36)

## 2016-01-30 LAB — TROPONIN I
TROPONIN I: 0.12 ng/mL — AB (ref <0.03)
TROPONIN I: 0.11 ng/mL — AB (ref <0.03)
Troponin I: 0.13 ng/mL (ref <0.03)

## 2016-01-30 LAB — PROCALCITONIN: Procalcitonin: 0.36 ng/mL

## 2016-01-30 LAB — HEPARIN LEVEL (UNFRACTIONATED): Heparin Unfractionated: <0.1 IU/mL — ABNORMAL LOW (ref 0.30–0.70)

## 2016-01-30 MED ORDER — FEBUXOSTAT 40 MG PO TABS
40.0000 mg | ORAL_TABLET | Freq: Every day | ORAL | Status: DC
  Administered 2016-01-30 – 2016-01-31 (×2): 40 mg via ORAL
  Filled 2016-01-30 (×2): qty 1

## 2016-01-30 MED ORDER — HYDRALAZINE HCL 20 MG/ML IJ SOLN
10.0000 mg | INTRAMUSCULAR | Status: DC | PRN
  Administered 2016-01-30 – 2016-01-31 (×3): 10 mg via INTRAVENOUS
  Filled 2016-01-30 (×3): qty 1

## 2016-01-30 MED ORDER — FUROSEMIDE 40 MG PO TABS
40.0000 mg | ORAL_TABLET | Freq: Every day | ORAL | Status: DC
  Administered 2016-01-30 – 2016-01-31 (×2): 40 mg via ORAL
  Filled 2016-01-30 (×2): qty 1

## 2016-01-30 MED ORDER — METOPROLOL TARTRATE 5 MG/5ML IV SOLN
5.0000 mg | Freq: Once | INTRAVENOUS | Status: AC
  Administered 2016-01-30: 5 mg via INTRAVENOUS
  Filled 2016-01-30: qty 5

## 2016-01-30 MED ORDER — OXYCODONE HCL 5 MG PO TABS
10.0000 mg | ORAL_TABLET | Freq: Four times a day (QID) | ORAL | Status: DC | PRN
  Administered 2016-01-30 – 2016-01-31 (×3): 10 mg via ORAL
  Filled 2016-01-30 (×3): qty 2

## 2016-01-30 MED ORDER — CALCIUM ACETATE (PHOS BINDER) 667 MG PO CAPS
1334.0000 mg | ORAL_CAPSULE | Freq: Three times a day (TID) | ORAL | Status: DC
  Administered 2016-01-30 – 2016-01-31 (×6): 1334 mg via ORAL
  Filled 2016-01-30 (×5): qty 2

## 2016-01-30 MED ORDER — OXYCODONE HCL 5 MG PO TABS
10.0000 mg | ORAL_TABLET | Freq: Three times a day (TID) | ORAL | Status: DC | PRN
  Administered 2016-01-30 (×2): 10 mg via ORAL
  Filled 2016-01-30 (×2): qty 2

## 2016-01-30 MED ORDER — HEPARIN 1000 UNIT/ML FOR PERITONEAL DIALYSIS
500.0000 [IU] | INTRAMUSCULAR | Status: DC | PRN
  Filled 2016-01-30: qty 0.5

## 2016-01-30 MED ORDER — VITAMIN D (ERGOCALCIFEROL) 1.25 MG (50000 UNIT) PO CAPS
50000.0000 [IU] | ORAL_CAPSULE | ORAL | Status: DC
  Administered 2016-01-31: 50000 [IU] via ORAL
  Filled 2016-01-30: qty 1

## 2016-01-30 MED ORDER — LEVOFLOXACIN 500 MG PO TABS
250.0000 mg | ORAL_TABLET | Freq: Every day | ORAL | Status: DC
  Administered 2016-01-31: 250 mg via ORAL
  Filled 2016-01-30: qty 1

## 2016-01-30 MED ORDER — HEPARIN BOLUS VIA INFUSION
4000.0000 [IU] | Freq: Once | INTRAVENOUS | Status: AC
  Administered 2016-01-30: 4000 [IU] via INTRAVENOUS
  Filled 2016-01-30: qty 4000

## 2016-01-30 MED ORDER — HEPARIN (PORCINE) IN NACL 100-0.45 UNIT/ML-% IJ SOLN
1270.0000 [IU]/h | INTRAMUSCULAR | Status: DC
  Administered 2016-01-30: 950 [IU]/h via INTRAVENOUS
  Filled 2016-01-30: qty 250

## 2016-01-30 MED ORDER — GUAIFENESIN-DM 100-10 MG/5ML PO SYRP
5.0000 mL | ORAL_SOLUTION | ORAL | Status: DC | PRN
  Administered 2016-01-30 (×2): 5 mL via ORAL
  Filled 2016-01-30 (×2): qty 5

## 2016-01-30 MED ORDER — HEPARIN SODIUM (PORCINE) 5000 UNIT/ML IJ SOLN
5000.0000 [IU] | Freq: Three times a day (TID) | INTRAMUSCULAR | Status: DC
  Administered 2016-01-30 – 2016-01-31 (×3): 5000 [IU] via SUBCUTANEOUS
  Filled 2016-01-30 (×2): qty 1

## 2016-01-30 MED ORDER — ONDANSETRON HCL 4 MG/2ML IJ SOLN
4.0000 mg | Freq: Four times a day (QID) | INTRAMUSCULAR | Status: DC | PRN
  Administered 2016-01-30 – 2016-01-31 (×3): 4 mg via INTRAVENOUS
  Filled 2016-01-30 (×3): qty 2

## 2016-01-30 MED ORDER — HEPARIN BOLUS VIA INFUSION
2400.0000 [IU] | Freq: Once | INTRAVENOUS | Status: DC
  Filled 2016-01-30: qty 2400

## 2016-01-30 MED ORDER — FUROSEMIDE 10 MG/ML IJ SOLN
40.0000 mg | Freq: Once | INTRAMUSCULAR | Status: AC
  Administered 2016-01-30: 40 mg via INTRAVENOUS
  Filled 2016-01-30: qty 4

## 2016-01-30 MED ORDER — EPOETIN ALFA 20000 UNIT/ML IJ SOLN
20000.0000 [IU] | Freq: Once | INTRAMUSCULAR | Status: AC
  Administered 2016-01-31: 20000 [IU] via SUBCUTANEOUS
  Filled 2016-01-30 (×3): qty 1

## 2016-01-30 MED ORDER — SODIUM BICARBONATE 650 MG PO TABS
650.0000 mg | ORAL_TABLET | Freq: Two times a day (BID) | ORAL | Status: DC
  Administered 2016-01-30 – 2016-01-31 (×4): 650 mg via ORAL
  Filled 2016-01-30 (×4): qty 1

## 2016-01-30 MED ORDER — DELFLEX-LC/1.5% DEXTROSE 344 MOSM/L IP SOLN
2000.0000 mL | INTRAPERITONEAL | Status: DC
  Administered 2016-01-30: 2000 mL via INTRAPERITONEAL
  Filled 2016-01-30: qty 3000

## 2016-01-30 MED ORDER — ACETAMINOPHEN 325 MG PO TABS
650.0000 mg | ORAL_TABLET | Freq: Four times a day (QID) | ORAL | Status: DC | PRN
  Administered 2016-01-30 – 2016-01-31 (×4): 650 mg via ORAL
  Filled 2016-01-30 (×5): qty 2

## 2016-01-30 MED ORDER — AMLODIPINE BESYLATE 5 MG PO TABS
5.0000 mg | ORAL_TABLET | Freq: Every day | ORAL | Status: DC
  Administered 2016-01-30 – 2016-01-31 (×2): 5 mg via ORAL
  Filled 2016-01-30 (×2): qty 1

## 2016-01-30 MED ORDER — MORPHINE SULFATE (PF) 4 MG/ML IV SOLN
1.0000 mg | Freq: Once | INTRAVENOUS | Status: AC
  Administered 2016-01-30: 1 mg via INTRAVENOUS
  Filled 2016-01-30: qty 1

## 2016-01-30 MED ORDER — ALBUTEROL SULFATE (2.5 MG/3ML) 0.083% IN NEBU
3.0000 mL | INHALATION_SOLUTION | Freq: Four times a day (QID) | RESPIRATORY_TRACT | Status: DC | PRN
  Administered 2016-01-30 (×2): 3 mL via RESPIRATORY_TRACT
  Filled 2016-01-30 (×3): qty 3

## 2016-01-30 MED ORDER — ONDANSETRON HCL 4 MG PO TABS: 4.0000 mg | ORAL_TABLET | Freq: Four times a day (QID) | ORAL | Status: DC | PRN

## 2016-01-30 MED ORDER — LOSARTAN POTASSIUM 50 MG PO TABS
100.0000 mg | ORAL_TABLET | Freq: Every day | ORAL | Status: DC
  Administered 2016-01-30 – 2016-01-31 (×2): 100 mg via ORAL
  Filled 2016-01-30 (×2): qty 2

## 2016-01-30 MED ORDER — ACETAMINOPHEN 650 MG RE SUPP: 650.0000 mg | Freq: Four times a day (QID) | RECTAL | Status: DC | PRN

## 2016-01-30 MED ORDER — HEPARIN SODIUM (PORCINE) 5000 UNIT/ML IJ SOLN
5000.0000 [IU] | Freq: Three times a day (TID) | INTRAMUSCULAR | Status: DC
  Administered 2016-01-30: 5000 [IU] via SUBCUTANEOUS
  Filled 2016-01-30 (×2): qty 1

## 2016-01-30 MED ORDER — METOPROLOL TARTRATE 5 MG/5ML IV SOLN
5.0000 mg | Freq: Once | INTRAVENOUS | Status: AC
  Administered 2016-01-30: 5 mg via INTRAVENOUS
  Filled 2016-01-30 (×2): qty 5

## 2016-01-30 MED ORDER — ATORVASTATIN CALCIUM 20 MG PO TABS
80.0000 mg | ORAL_TABLET | Freq: Once | ORAL | Status: AC
  Administered 2016-01-30: 80 mg via ORAL
  Filled 2016-01-30: qty 4

## 2016-01-30 MED ORDER — GENTAMICIN SULFATE 0.1 % EX CREA
1.0000 "application " | TOPICAL_CREAM | Freq: Every day | CUTANEOUS | Status: DC
  Administered 2016-01-30 – 2016-01-31 (×2): 1 via TOPICAL
  Filled 2016-01-30: qty 15

## 2016-01-30 NOTE — Consult Note (Signed)
Date: 01/30/2016                  Patient Name:  Louis Lee  MRN: [id number]  DOB: 1980-07-27  Age / Sex: 35 y.o., male         PCP: [place]                 Service Requesting Consult: Internal medicine                 Reason for Consult: ESRD/PD            History of Present Illness: Patient is a 35 y.o. 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  Patient was admitted for further evaluation. He is diagnosed with pneumonia. He is currently being treated with Levaquin. Nephrology consult requested for management of dialysis. Patient feels that he is still short of breath this morning. PD was done successfully last night. Ultrafiltration of 2000 cc was obtained. Patient states that he does PD every other day at night due to good residual renal function although his BUN and creatinine are critically elevated.     Medications: Outpatient medications: Prescriptions Prior to Admission  Medication Sig Dispense Refill Last Dose  . albuterol (PROVENTIL HFA;VENTOLIN HFA) 108 (90 Base) MCG/ACT inhaler Inhale 2 puffs into the lungs every 6 (six) hours as needed.   prn at prn  . amLODipine (NORVASC) 5 MG tablet Take 1 tablet by mouth daily.   Past Week at Unknown time  . calcium acetate (PHOSLO) 667 MG capsule Take 1,334 mg by mouth 3 (three) times daily with meals. Take 2 tablets (1,337mg ) with each meal and between meals for total of 3 times per day.   unknown at unknown  . furosemide (LASIX) 40 MG tablet Take 1 tablet by mouth daily.   unknown at unknown  . losartan (COZAAR) 100 MG tablet Take 100 mg by mouth daily.   Past Week at Unknown time  . Oxycodone HCl 10 MG TABS Take  10 mg by mouth every 8 (eight) hours as needed.   unknown at unknown  . sodium bicarbonate 650 MG tablet Take 650 mg by mouth 2 (two) times daily.   unknown at unknown  . Vitamin D, Ergocalciferol, (DRISDOL) 50000 units CAPS capsule Take 1 capsule by mouth once a week. wed   unknown at unknown  . febuxostat (ULORIC) 40 MG tablet Take 1 tablet by mouth daily.   Not Taking at Unknown time    Current medications: Current Facility-Administered Medications  Medication Dose Route Frequency Provider Last Rate Last Dose  . acetaminophen (TYLENOL) tablet 650 mg  650 mg Oral Q6H PRN Fritzi Mandes, MD   650 mg at 01/30/16 0914   Or  . acetaminophen (TYLENOL) suppository 650 mg  650 mg Rectal Q6H PRN Fritzi Mandes, MD      . albuterol (PROVENTIL) (2.5 MG/3ML) 0.083% nebulizer solution 3 mL  3 mL Inhalation Q6H PRN Fritzi Mandes, MD   3 mL at 01/30/16 0406  . amLODipine (NORVASC) tablet 5 mg  5 mg Oral Daily Fritzi Mandes, MD   5 mg at 01/30/16 0944  . calcium acetate (PHOSLO) capsule 1,334 mg  1,334 mg Oral  TID WC Fritzi Mandes, MD   1,334 mg at 01/30/16 0914  . dialysis solution 2.5% low-MG/low-CA dianeal solution   Intraperitoneal Q24H Ammara Raj, MD      . febuxostat (ULORIC) tablet 40 mg  40 mg Oral Daily Fritzi Mandes, MD   40 mg at 01/30/16 0945  . furosemide (LASIX) tablet 40 mg  40 mg Oral Daily Alexis Hugelmeyer, DO   40 mg at 01/30/16 0944  . gentamicin cream (GARAMYCIN) 0.1 % 1 application  1 application Topical Daily Murlean Iba, MD   1 application at 28/00/34 0030  . guaiFENesin-dextromethorphan (ROBITUSSIN DM) 100-10 MG/5ML syrup 5 mL  5 mL Oral Q4H PRN Fritzi Mandes, MD   5 mL at 01/30/16 0243  . heparin 1000 unit/ml injection 500 Units  500 Units Intraperitoneal PRN Murlean Iba, MD      . heparin ADULT infusion 100 units/mL (25000 units/279mL sodium chloride 0.45%)  950 Units/hr Intravenous Continuous Alexis Hugelmeyer, DO 9.5 mL/hr at 01/30/16 0514 950 Units/hr at 01/30/16 0514  . hydrALAZINE  (APRESOLINE) injection 10 mg  10 mg Intravenous Q4H PRN Saundra Shelling, MD   10 mg at 01/30/16 0644  . [START ON 01/31/2016] levofloxacin (LEVAQUIN) tablet 250 mg  250 mg Oral Daily Fritzi Mandes, MD      . losartan (COZAAR) tablet 100 mg  100 mg Oral Daily Alexis Hugelmeyer, DO   100 mg at 01/30/16 0944  . ondansetron (ZOFRAN) tablet 4 mg  4 mg Oral Q6H PRN Fritzi Mandes, MD       Or  . ondansetron (ZOFRAN) injection 4 mg  4 mg Intravenous Q6H PRN Fritzi Mandes, MD      . oxyCODONE (Oxy IR/ROXICODONE) immediate release tablet 10 mg  10 mg Oral Q8H PRN Fritzi Mandes, MD   10 mg at 01/30/16 0358  . sodium bicarbonate tablet 650 mg  650 mg Oral BID Fritzi Mandes, MD   650 mg at 01/30/16 0945  . [START ON 01/31/2016] Vitamin D (Ergocalciferol) (DRISDOL) capsule 50,000 Units  50,000 Units Oral Weekly Fritzi Mandes, MD          Allergies: No Known Allergies    Past Medical History: Past Medical History:  Diagnosis Date  . Renal disorder    pt is listed for kidney transplant     Past Surgical History: Past Surgical History:  Procedure Laterality Date  . GASTRIC BYPASS       Family History: No family history on file.   Social History: Social History   Social History  . Marital status: Married    Spouse name: N/A  . Number of children: N/A  . Years of education: N/A   Occupational History  . Not on file.   Social History Main Topics  . Smoking status: Never Smoker  . Smokeless tobacco: Never Used  . Alcohol use No  . Drug use: No  . Sexual activity: Not on file   Other Topics Concern  . Not on file   Social History Narrative  . No narrative on file     Review of Systems: Gen: No fevers or chills  HEENT: No vision or hearing problems  CV: Chest pain with coughing, nonexertional,  Resp: Cough, brownish sputum  GI:No nausea or vomiting. Eating outside food from [place] in the room  GU : States he has good urine output, no blood in the urine, no dysuria.  MS: Achy all over  this morning otherwise able to ambulate  Derm:   Multiple tattoos,  Psych:No complaints  Heme: No complaints  Neuro: No complaints  Endocrine. No complaints  Vital Signs: Blood pressure (!) 174/103, pulse 94, temperature 97.9 F (36.6 C), temperature source Oral, resp. rate 18, height 5\' 4"  (1.626 m), weight 91.8 kg (202 lb 6.4 oz), SpO2 97 %.   Intake/Output Summary (Last 24 hours) at 01/30/16 1014 Last data filed at 01/30/16 0850  Gross per 24 hour  Intake              150 ml  Output             2518 ml  Net            -2368 ml    Weight trends: Filed Weights   01/29/16 1749 01/30/16 0248 01/30/16 0852  Weight: 94.8 kg (209 lb) 95.7 kg (211 lb) 91.8 kg (202 lb 6.4 oz)    Physical Exam: General:  No acute distress, laying in the bed  HEENT Anicteric, moist oral mucous membranes   Neck:  Supple  Lungs: Diffuse rhonchi bilaterally, mild basilar crackles  Heart::  Regular, and no rub or gallop  Abdomen: Soft, nontender, nondistended  Extremities:  No peripheral edema  Neurologic: Alert, oriented  Skin: No acute rashes, multiple tattoos  Access: PD catheter in place          Lab results: Basic Metabolic Panel:  Recent Labs Lab 01/29/16 1801 01/30/16 0420  NA 136 135  K 6.1* 5.3*  CL 101 101  CO2 17* 17*  GLUCOSE 87 102*  BUN 109* 113*  CREATININE 15.17* 14.86*  CALCIUM 8.0* 8.0*    Liver Function Tests:  Recent Labs Lab 01/29/16 1801  AST 17  ALT 17  ALKPHOS 75  BILITOT 0.8  PROT 7.1  ALBUMIN 2.8*   No results for input(s): LIPASE, AMYLASE in the last 168 hours. No results for input(s): AMMONIA in the last 168 hours.  CBC:  Recent Labs Lab 01/29/16 1801  WBC 7.4  HGB 7.9*  HCT 23.8*  MCV 84.8  PLT 263    Cardiac Enzymes:  Recent Labs Lab 01/30/16 0701  TROPONINI 0.12*    BNP: Invalid input(s): POCBNP  CBG: No results for input(s): GLUCAP in the last 168 hours.  Microbiology: Recent Results (from the past 720 hour(s))   Blood culture (routine x 2)     Status: None (Preliminary result)   Collection Time: 01/29/16  8:18 PM  Result Value Ref Range Status   Specimen Description BLOOD RIGHT AC  Final   Special Requests   Final    BOTTLES DRAWN AEROBIC AND ANAEROBIC ANA12CC AER11CC   Culture NO GROWTH < 12 HOURS  Final   Report Status PENDING  Incomplete  Blood culture (routine x 2)     Status: None (Preliminary result)   Collection Time: 01/29/16  8:18 PM  Result Value Ref Range Status   Specimen Description BLOOD LEFT AC  Final   Special Requests BOTTLES DRAWN AEROBIC AND ANAEROBIC ANA9CC AER11CC  Final   Culture NO GROWTH < 12 HOURS  Final   Report Status PENDING  Incomplete     Coagulation Studies:  Recent Labs  01/30/16 0420  LABPROT 15.2  INR 1.19    Urinalysis: No results for input(s): COLORURINE, LABSPEC, PHURINE, GLUCOSEU, HGBUR, BILIRUBINUR, KETONESUR, PROTEINUR, UROBILINOGEN, NITRITE, LEUKOCYTESUR in the last 72 hours.  Invalid input(s): APPERANCEUR      Imaging: Dg Chest 2 View  Result Date: 01/29/2016 CLINICAL DATA:  Cough and chills for 2 days  EXAM: CHEST  2 VIEW COMPARISON:  03/08/2015 FINDINGS: Cardiac shadow is at the upper limits of normal in size. The lungs are well aerated bilaterally and demonstrate some patchy infiltrative changes bilaterally. No sizable effusion is seen. No acute bony abnormality is noted. IMPRESSION: Patchy infiltrates bilaterally slightly worse on the right than the left. Electronically Signed   By: Inez Catalina M.D.   On: 01/29/2016 18:54      Assessment & Plan: Pt is a 35 y.o. 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   1. End-stage renal disease Peritoneal  dialysis We will continue 5 cycles of 2000 cc  Volume appears to be corrected therefore we will use 1.5% solution  2. Anemia of chronic kidney disease Hemoglobin 7.9 We will prescribe Procrit  3. Hyperkalemia  potassium level corrected to 5.3 this morning Patient was counseled regarding low potassium diet  4. Shortness of breath Antibiotics as per internal medicine team

## 2016-01-30 NOTE — Progress Notes (Signed)
Post CCPD assessment

## 2016-01-30 NOTE — Progress Notes (Addendum)
CCPD completed without issue. UF 2018. Patient tolerated well  Report given to Delia Chimes. Dr. Candiss Norse in unit, updated as well.

## 2016-01-30 NOTE — Progress Notes (Signed)
Patient ID: Louis Lee, male   DOB: 18-Dec-1980, 35 y.o.   MRN: [id number]   Called by nursing regarding patient with second elevated troponin at 0.12 --> 0.13  Possibly demand ischemia in the setting of ESRD. Will start heparin, beta-blocker, statin.  Patient already taking aspirin.

## 2016-01-30 NOTE — Progress Notes (Signed)
Pt BP remaining elevated. Dr. Orbie Pyo notified and orders received for Hydralizine 10 mg IV q 4hr PRN for BP greater than 712 systolic 90 dialositic. Primary nurse to continue to monitor.

## 2016-01-30 NOTE — Progress Notes (Signed)
Primary Nurse notified Dr. Estanislado Pandy and requested Telemetryt order. Orders received. Primary nurse to continue to monitor.

## 2016-01-30 NOTE — Progress Notes (Signed)
Pt troponin elevated at 0.13. Primary nurse notified Dr. Almyra Free. Primary nurse to continue to monitor.

## 2016-01-30 NOTE — Progress Notes (Addendum)
CCPD initiated without issue at bedside. Exit site care performed. Slightly more serous drainage today than yesterday and site looks slightly red as stated previously. Run time 8 hours. Please call [facility] 800 number if any issues with cycler overnight. Notified primary RN Remo Lipps.

## 2016-01-30 NOTE — Progress Notes (Signed)
[place] at [place] NAME: Louis Lee    MR#:  [id number]  DATE OF BIRTH:  [birth year]  SUBJECTIVE:  CHIEF COMPLAINT:   Chief Complaint  Patient presents with  . Generalized Body Aches  . Nasal Congestion     On peritoneal dialysis, came with fever and chest tightness and cough. Found to have pneumonia.   His troponin was slightly high last night, started on heparin IV drip by covering physician, but patient denies any chest pain complaints and troponin remained stable on further follow-up.  REVIEW OF SYSTEMS:  CONSTITUTIONAL: No fever, fatigue or weakness.  EYES: No blurred or double vision.  EARS, NOSE, AND THROAT: No tinnitus or ear pain.  RESPIRATORY: Positive for cough, shortness of breath, no wheezing or hemoptysis.  CARDIOVASCULAR: Positive for chest pain along with severe coughing, no orthopnea, edema.  GASTROINTESTINAL: No nausea, vomiting, diarrhea or abdominal pain.  GENITOURINARY: No dysuria, hematuria.  ENDOCRINE: No polyuria, nocturia,  HEMATOLOGY: No anemia, easy bruising or bleeding SKIN: No rash or lesion. MUSCULOSKELETAL: No joint pain or arthritis.   NEUROLOGIC: No tingling, numbness, weakness.  PSYCHIATRY: No anxiety or depression.   ROS  DRUG ALLERGIES:  No Known Allergies  VITALS:  Blood pressure (!) 157/95, pulse 98, temperature 97.6 F (36.4 C), temperature source Oral, resp. rate 19, height 5\' 4"  (1.626 m), weight 91.8 kg (202 lb 6.4 oz), SpO2 94 %.  PHYSICAL EXAMINATION:  GENERAL:  35 y.o.-year-old patient lying in the bed with no acute distress.  EYES: Pupils equal, round, reactive to light and accommodation. No scleral icterus. Extraocular muscles intact.  HEENT: Head atraumatic, normocephalic. Oropharynx and nasopharynx clear.  NECK:  Supple, no jugular venous distention. No thyroid enlargement, no tenderness.  LUNGS: Normal breath sounds bilaterally, no wheezing, Some crepitation. No use of accessory  muscles of respiration.  CARDIOVASCULAR: S1, S2 normal. No murmurs, rubs, or gallops.  ABDOMEN: Soft, nontender, nondistended. Bowel sounds present. No organomegaly or mass. Dialysis catheter present on abdomen. EXTREMITIES: No pedal edema, cyanosis, or clubbing.  NEUROLOGIC: Cranial nerves II through XII are intact. Muscle strength 5/5 in all extremities. Sensation intact. Gait not checked.  PSYCHIATRIC: The patient is alert and oriented x 3.  SKIN: No obvious rash, lesion, or ulcer.   Physical Exam LABORATORY PANEL:   CBC  Recent Labs Lab 01/30/16 1249  WBC 5.7  HGB 7.9*  HCT 24.0*  PLT 288   ------------------------------------------------------------------------------------------------------------------  Chemistries   Recent Labs Lab 01/29/16 1801 01/30/16 0420  NA 136 135  K 6.1* 5.3*  CL 101 101  CO2 17* 17*  GLUCOSE 87 102*  BUN 109* 113*  CREATININE 15.17* 14.86*  CALCIUM 8.0* 8.0*  AST 17  --   ALT 17  --   ALKPHOS 75  --   BILITOT 0.8  --    ------------------------------------------------------------------------------------------------------------------  Cardiac Enzymes  Recent Labs Lab 01/30/16 0701 01/30/16 1249  TROPONINI 0.12* 0.11*   ------------------------------------------------------------------------------------------------------------------  RADIOLOGY:  Dg Chest 2 View  Result Date: 01/29/2016 CLINICAL DATA:  Cough and chills for 2 days EXAM: CHEST  2 VIEW COMPARISON:  03/08/2015 FINDINGS: Cardiac shadow is at the upper limits of normal in size. The lungs are well aerated bilaterally and demonstrate some patchy infiltrative changes bilaterally. No sizable effusion is seen. No acute bony abnormality is noted. IMPRESSION: Patchy infiltrates bilaterally slightly worse on the right than the left. Electronically Signed   By: Inez Catalina M.D.   On: 01/29/2016 18:54  ASSESSMENT AND PLAN:   Active Problems:   SOB (shortness of breath)  on exertion  Billy Turvey  is a 35 y.o. male with a known history of End-stage renal disease on peritoneal dialysis, hypertension, history of gastric bypass, comes to the emergency room with increasing shortness of breath cough congestion. Patient states his symptoms started with URI and lasts 1-2 days has been coughing up brownish phlegm. Denies any chest pain. Has been having some shortness of breath more so with exertion.  1.Acute respiratory distress with shortness of breath URI congestion with possible bilateral interstitial edema versus pneumonia - Oral Levaquin -When necessary nebs and oral inhalers - saturation is fine on room air.  2. End stage renal disease on peritoneal dialysis -Patient's peritoneal dialysis orders have been given by dr Candiss Norse -Potassium elevated at 6 - corrected K after PD.  3. Malignant hypertension  Resume home meds- stable now.  4. DVT prophylaxis subcutaneous heparin     All the records are reviewed and case discussed with Care Management/Social Workerr. Management plans discussed with the patient, family and they are in agreement.  CODE STATUS: full.  TOTAL TIME TAKING CARE OF THIS PATIENT: 35 minutes.     POSSIBLE D/C IN 1-2 DAYS, DEPENDING ON CLINICAL CONDITION.   Vaughan Basta M.D on 01/30/2016   Between 7am to 6pm - Pager - [phone]  After 6pm go to [email or web address] - password [email or web address] [facility]  Office  [phone]  CC: Primary care physician; [facility]  Note: This dictation was prepared with Dragon dictation along with smaller phrase technology. Any transcriptional errors that result from this process are unintentional.

## 2016-01-30 NOTE — Progress Notes (Signed)
ANTICOAGULATION CONSULT NOTE - Initial Consult  Pharmacy Consult for heparin drip Indication: ACS/STEMI  No Known Allergies  Patient Measurements: Height: 5\' 4"  (162.6 cm) Weight: 202 lb 6.4 oz (91.8 kg) IBW/kg (Calculated) : 59.2 Heparin Dosing Weight: 81kg  Vital Signs: Temp: 97.6 F (36.4 C) (12/12 1250) Temp Source: Oral (12/12 1250) BP: 157/95 (12/12 1250) Pulse Rate: 98 (12/12 1250)  Labs:  Recent Labs  01/29/16 1801 01/30/16 0104 01/30/16 0420 01/30/16 0701 01/30/16 1249  HGB 7.9*  --   --   --  7.9*  HCT 23.8*  --   --   --  24.0*  PLT 263  --   --   --  288  APTT  --   --  32  --   --   LABPROT  --   --  15.2  --   --   INR  --   --  1.19  --   --   HEPARINUNFRC  --   --   --   --  <0.10*  CREATININE 15.17*  --  14.86*  --   --   TROPONINI 0.12* 0.13*  --  0.12* 0.11*    Estimated Creatinine Clearance: 7 mL/min (by C-G formula based on SCr of 14.86 mg/dL (H)).   Medical History: Past Medical History:  Diagnosis Date  . Renal disorder    pt is listed for kidney transplant    Medications:  No anticoag in PTA meds.  Assessment: Patient presents with body aches and chills found to have PNA. During admission, pt has elevated troponins 0.12 -> 0.13.   Goal of Therapy:  Heparin level 0.3-0.7 units/ml Monitor platelets by anticoagulation protocol: Yes   Plan:  4000 unit bolus and initial rate of 950 units/hr. First heparin level 8 hours after start of infusion.  12/12 @ 12:49 HL <0.10; subtherapeutic. Will bolus 2400units and increase to 1270 units/hr. Spoke with RN about heparin drip. RN says drip has been running the whole time and no signs/symptoms of bleeding. RN notified of heparin drip being increased. Will draw a HL in 6hrs.    Loree Fee, PharmD 01/30/2016,1:58 PM

## 2016-01-30 NOTE — Progress Notes (Signed)
CCPD initiated without issue. Some reddness to insertion site with dressing change. Pt states he may have hit/pulled on his catheter during work. He is not sure. Slight sanguineous drainage. Site cleaned and gent cream applied. Take off time approx 0835am.

## 2016-01-30 NOTE — Progress Notes (Signed)
Pt SOB; tachypnea @ 36 respirations; Pt given breathing treattment; Pt stated that he felt that it did not help. Pt stated that he had some chest and head pain when coughing. Robutissum DM given prior, Primary nurse notified Dr. Maeola Harman of pt condition. Orders received for IV lasix 40 mg once. Primary nurse to continue to monitor pt

## 2016-01-30 NOTE — Progress Notes (Signed)
[facility] received an order for Pt in Rm 225, who requested advanced directives. [place] visited the Pt, and provided AD explanation to the Pt, but the Pt requested more time to go over it and would complete it at a later time, he suggested. CH offered prayers for the Pt and his dad, and then left.     01/30/16 2100  Clinical Encounter Type  Visited With Patient;Patient and family together  Visit Type Initial;Follow-up;ED  Referral From Nurse  Consult/Referral To Chaplain  Spiritual Encounters  Spiritual Needs Brochure;Prayer;Other (Comment)  Stress Factors  Patient Stress Factors None identified;Exhausted  Family Stress Factors None identified

## 2016-01-30 NOTE — Progress Notes (Signed)
Pt alert and anxious for DR to visit  Release for discharge. Pt has had visitors. CH is available.    01/30/16 1110  Clinical Encounter Type  Visited With Patient  Visit Type Initial  Referral From Nurse  Spiritual Encounters  Spiritual Needs Emotional  Stress Factors  Patient Stress Factors None identified

## 2016-01-30 NOTE — Progress Notes (Signed)
Pre CCPD assessment   

## 2016-01-30 NOTE — Progress Notes (Signed)
ANTICOAGULATION CONSULT NOTE - Initial Consult  Pharmacy Consult for heparin drip Indication: ACS/STEMI  No Known Allergies  Patient Measurements: Height: 5\' 4"  (162.6 cm) Weight: 211 lb (95.7 kg) IBW/kg (Calculated) : 59.2 Heparin Dosing Weight: 81kg  Vital Signs: Temp: 98.3 F (36.8 C) (12/12 0414) Temp Source: Oral (12/12 0013) BP: 163/99 (12/12 0414) Pulse Rate: 89 (12/12 0414)  Labs:  Recent Labs  01/29/16 1801 01/30/16 0104 01/30/16 0420  HGB 7.9*  --   --   HCT 23.8*  --   --   PLT 263  --   --   APTT  --   --  32  LABPROT  --   --  15.2  INR  --   --  1.19  CREATININE 15.17*  --  14.86*  TROPONINI 0.12* 0.13*  --     Estimated Creatinine Clearance: 7.2 mL/min (by C-G formula based on SCr of 14.86 mg/dL (H)).   Medical History: Past Medical History:  Diagnosis Date  . Renal disorder    pt is listed for kidney transplant    Medications:  No anticoag in PTA meds.  Assessment:  Goal of Therapy:  Heparin level 0.3-0.7 units/ml Monitor platelets by anticoagulation protocol: Yes   Plan:  4000 unit bolus and initial rate of 950 units/hr. First heparin level 8 hours after start of infusion.  Draper Gallon S 01/30/2016,5:18 AM

## 2016-01-30 NOTE — Progress Notes (Signed)
Pt BP elevated, HR elevated; Pt complaining of severe headache. Primary nurse notified Dr. Pollyann Samples.  Orders received for Morphine and Metoprolol.

## 2016-01-31 LAB — CBC
HCT: 24.2 % — ABNORMAL LOW (ref 40.0–52.0)
HEMOGLOBIN: 7.9 g/dL — AB (ref 13.0–18.0)
MCH: 28 pg (ref 26.0–34.0)
MCHC: 32.7 g/dL (ref 32.0–36.0)
MCV: 85.5 fL (ref 80.0–100.0)
PLATELETS: 269 10*3/uL (ref 150–440)
RBC: 2.83 MIL/uL — AB (ref 4.40–5.90)
RDW: 19 % — ABNORMAL HIGH (ref 11.5–14.5)
WBC: 8.7 10*3/uL (ref 3.8–10.6)

## 2016-01-31 LAB — BODY FLUID CELL COUNT WITH DIFFERENTIAL
EOS FL: 1 %
Lymphs, Fluid: 26 %
Monocyte-Macrophage-Serous Fluid: 55 %
Neutrophil Count, Fluid: 18 %
Total Nucleated Cell Count, Fluid: 17 cu mm

## 2016-01-31 MED ORDER — BUTALBITAL-APAP-CAFFEINE 50-325-40 MG PO TABS
2.0000 | ORAL_TABLET | Freq: Once | ORAL | Status: AC
  Administered 2016-01-31: 2 via ORAL
  Filled 2016-01-31: qty 2

## 2016-01-31 MED ORDER — FUROSEMIDE 80 MG PO TABS
80.0000 mg | ORAL_TABLET | Freq: Two times a day (BID) | ORAL | Status: DC
  Administered 2016-01-31 (×2): 80 mg via ORAL
  Filled 2016-01-31 (×2): qty 1

## 2016-01-31 MED ORDER — AMLODIPINE BESYLATE 5 MG PO TABS
5.0000 mg | ORAL_TABLET | Freq: Once | ORAL | Status: AC
  Administered 2016-01-31: 5 mg via ORAL
  Filled 2016-01-31: qty 1

## 2016-01-31 MED ORDER — LEVOFLOXACIN 250 MG PO TABS: 250.0000 mg | ORAL_TABLET | Freq: Every day | ORAL | 0 refills | Status: DC

## 2016-01-31 MED ORDER — GUAIFENESIN-DM 100-10 MG/5ML PO SYRP: 5.0000 mL | ORAL_SOLUTION | ORAL | 0 refills | Status: DC | PRN

## 2016-01-31 MED ORDER — FUROSEMIDE 80 MG PO TABS: 80.0000 mg | ORAL_TABLET | Freq: Two times a day (BID) | ORAL | Status: DC

## 2016-01-31 MED ORDER — MEPERIDINE HCL 25 MG/ML IJ SOLN
25.0000 mg | Freq: Once | INTRAMUSCULAR | Status: AC
  Administered 2016-01-31: 25 mg via INTRAVENOUS
  Filled 2016-01-31: qty 1

## 2016-01-31 MED ORDER — AMLODIPINE BESYLATE 10 MG PO TABS: 10.0000 mg | ORAL_TABLET | Freq: Every day | ORAL | Status: DC

## 2016-01-31 MED ORDER — METOPROLOL TARTRATE 25 MG PO TABS: 25.0000 mg | ORAL_TABLET | Freq: Two times a day (BID) | ORAL | 0 refills | Status: AC

## 2016-01-31 MED ORDER — METOPROLOL TARTRATE 25 MG PO TABS
25.0000 mg | ORAL_TABLET | Freq: Two times a day (BID) | ORAL | Status: DC
  Administered 2016-01-31: 25 mg via ORAL
  Filled 2016-01-31: qty 1

## 2016-01-31 NOTE — Discharge Instructions (Signed)
Resume your Peritoneal dialysis as before  Community-Acquired Pneumonia, Adult Pneumonia is an infection of the lungs. There are different types of pneumonia. One type can develop while a person is in a hospital. A different type, called community-acquired pneumonia, develops in people who are not, or have not recently been, in the hospital or other health care facility. What are the causes? Pneumonia may be caused by bacteria, viruses, or funguses. Community-acquired pneumonia is often caused by Streptococcus pneumonia bacteria. These bacteria are often passed from one person to another by breathing in droplets from the cough or sneeze of an infected person. What increases the risk? The condition is more likely to develop in:  People who havechronic diseases, such as chronic obstructive pulmonary disease (COPD), asthma, congestive heart failure, cystic fibrosis, diabetes, or kidney disease.  People who haveearly-stage or late-stage HIV.  People who havesickle cell disease.  People who havehad their spleen removed (splenectomy).  People who havepoor Human resources officer.  People who havemedical conditions that increase the risk of breathing in (aspirating) secretions their own mouth and nose.  People who havea weakened immune system (immunocompromised).  People who smoke.  People whotravel to areas where pneumonia-causing germs commonly exist.  People whoare around animal habitats or animals that have pneumonia-causing germs, including birds, bats, rabbits, cats, and farm animals. What are the signs or symptoms? Symptoms of this condition include:  Adry cough.  A wet (productive) cough.  Fever.  Sweating.  Chest pain, especially when breathing deeply or coughing.  Rapid breathing or difficulty breathing.  Shortness of breath.  Shaking chills.  Fatigue.  Muscle aches. How is this diagnosed? Your health care provider will take a medical history and perform a  physical exam. You may also have other tests, including:  Imaging studies of your chest, including X-rays.  Tests to check your blood oxygen level and other blood gases.  Other tests on blood, mucus (sputum), fluid around your lungs (pleural fluid), and urine. If your pneumonia is severe, other tests may be done to identify the specific cause of your illness. How is this treated? The type of treatment that you receive depends on many factors, such as the cause of your pneumonia, the medicines you take, and other medical conditions that you have. For most adults, treatment and recovery from pneumonia may occur at home. In some cases, treatment must happen in a hospital. Treatment may include:  Antibiotic medicines, if the pneumonia was caused by bacteria.  Antiviral medicines, if the pneumonia was caused by a virus.  Medicines that are given by mouth or through an IV tube.  Oxygen.  Respiratory therapy. Although rare, treating severe pneumonia may include:  Mechanical ventilation. This is done if you are not breathing well on your own and you cannot maintain a safe blood oxygen level.  Thoracentesis. This procedureremoves fluid around one lung or both lungs to help you breathe better. Follow these instructions at home:  Take over-the-counter and prescription medicines only as told by your health care provider.  Only takecough medicine if you are losing sleep. Understand that cough medicine can prevent your bodys natural ability to remove mucus from your lungs.  If you were prescribed an antibiotic medicine, take it as told by your health care provider. Do not stop taking the antibiotic even if you start to feel better.  Sleep in a semi-upright position at night. Try sleeping in a reclining chair, or place a few pillows under your head.  Do not use tobacco products, including  cigarettes, chewing tobacco, and e-cigarettes. If you need help quitting, ask your health care  provider.  Drink enough water to keep your urine clear or pale yellow. This will help to thin out mucus secretions in your lungs. How is this prevented? There are ways that you can decrease your risk of developing community-acquired pneumonia. Consider getting a pneumococcal vaccine if:  You are older than 36 years of age.  You are older than 36 years of age and are undergoing cancer treatment, have chronic lung disease, or have other medical conditions that affect your immune system. Ask your health care provider if this applies to you. There are different types and schedules of pneumococcal vaccines. Ask your health care provider which vaccination option is best for you. You may also prevent community-acquired pneumonia if you take these actions:  Get an influenza vaccine every year. Ask your health care provider which type of influenza vaccine is best for you.  Go to the dentist on a regular basis.  Wash your hands often. Use hand sanitizer if soap and water are not available. Contact a health care provider if:  You have a fever.  You are losing sleep because you cannot control your cough with cough medicine. Get help right away if:  You have worsening shortness of breath.  You have increased chest pain.  Your sickness becomes worse, especially if you are an older adult or have a weakened immune system.  You cough up blood. This information is not intended to replace advice given to you by your health care provider. Make sure you discuss any questions you have with your health care provider. Document Released: 02/04/2005 Document Revised: 06/15/2015 Document Reviewed: 06/01/2014 Elsevier Interactive Patient Education  2017 [employer].

## 2016-01-31 NOTE — Discharge Summary (Signed)
[facility] NAME: Louis Lee    MR#:  [id number]  DATE OF BIRTH:  1981-01-24  DATE OF ADMISSION:  01/29/2016 ADMITTING PHYSICIAN: Fritzi Mandes, MD  DATE OF DISCHARGE: 01/31/16  PRIMARY CARE PHYSICIAN: [facility]    ADMISSION DIAGNOSIS:  End stage renal disease (HCC) [N18.6] Elevated troponin I level [R74.8] Anemia, unspecified type [D64.9] Community acquired pneumonia, unspecified laterality [J18.9]  DISCHARGE DIAGNOSIS:  Acute bronchitis Headache ESRD on HD  SECONDARY DIAGNOSIS:   Past Medical History:  Diagnosis Date  . Renal disorder    pt is listed for kidney transplant    HOSPITAL COURSE:   Louis Lee a 35 y.o. malewith a known history of End-stage renal disease on peritoneal dialysis, hypertension, history of gastric bypass, comes to the emergency room with increasing shortness of breath cough congestion. Patient states his symptoms started with URI and lasts 1-2 days has been coughing up brownish phlegm. Denies any chest pain. Has been having some shortness of breath more so with exertion.  1.Acute respiratory distress with shortness of breath URI congestion with possible bilateral interstitial edema versus acute brocnhitis - Oral Levaquin -When necessary nebs and oral inhalers - saturation is fine on room air. -pt did not qualify for oxygen  2. End stage renal disease on peritoneal dialysis -Patient's peritoneal dialysis orders have been given by dr Candiss Norse -Potassium elevated at 6--5.3 - corrected K after PD.   3. Malignant hypertension -resumed home meds  4. DVT prophylaxis subcutaneous heparin  5. Headache. Tried fioricet,demerol and morphine with some relief No neuro deficits. Appears tension like HA -prn oxycodone  D/c home Pt agreeable CONSULTS OBTAINED:    DRUG ALLERGIES:  No Known Allergies  DISCHARGE MEDICATIONS:   Current Discharge Medication List     START taking these medications   Details  guaiFENesin-dextromethorphan (ROBITUSSIN DM) 100-10 MG/5ML syrup Take 5 mLs by mouth every 4 (four) hours as needed for cough. Qty: 118 mL, Refills: 0    levofloxacin (LEVAQUIN) 250 MG tablet Take 1 tablet (250 mg total) by mouth daily. Qty: 5 tablet, Refills: 0    metoprolol tartrate (LOPRESSOR) 25 MG tablet Take 1 tablet (25 mg total) by mouth 2 (two) times daily. Qty: 60 tablet, Refills: 0      CONTINUE these medications which have NOT CHANGED   Details  albuterol (PROVENTIL HFA;VENTOLIN HFA) 108 (90 Base) MCG/ACT inhaler Inhale 2 puffs into the lungs every 6 (six) hours as needed.    amLODipine (NORVASC) 5 MG tablet Take 1 tablet by mouth daily.    calcium acetate (PHOSLO) 667 MG capsule Take 1,334 mg by mouth 3 (three) times daily with meals. Take 2 tablets (1,337mg ) with each meal and between meals for total of 3 times per day.    furosemide (LASIX) 40 MG tablet Take 1 tablet by mouth daily.    losartan (COZAAR) 100 MG tablet Take 100 mg by mouth daily.    Oxycodone HCl 10 MG TABS Take 10 mg by mouth every 8 (eight) hours as needed.    sodium bicarbonate 650 MG tablet Take 650 mg by mouth 2 (two) times daily.    Vitamin D, Ergocalciferol, (DRISDOL) 50000 units CAPS capsule Take 1 capsule by mouth once a week. wed    febuxostat (ULORIC) 40 MG tablet Take 1 tablet by mouth daily.        If you experience worsening of your admission symptoms, develop shortness of breath, life threatening  emergency, suicidal or homicidal thoughts you must seek medical attention immediately by calling 911 or calling your MD immediately  if symptoms less severe.  You Must read complete instructions/literature along with all the possible adverse reactions/side effects for all the Medicines you take and that have been prescribed to you. Take any new Medicines after you have completely understood and accept all the possible adverse reactions/side effects.    Please note  You were cared for by a hospitalist during your hospital stay. If you have any questions about your discharge medications or the care you received while you were in the hospital after you are discharged, you can call the unit and asked to speak with the hospitalist on call if the hospitalist that took care of you is not available. Once you are discharged, your primary care physician will handle any further medical issues. Please note that NO REFILLS for any discharge medications will be authorized once you are discharged, as it is imperative that you return to your primary care physician (or establish a relationship with a primary care physician if you do not have one) for your aftercare needs so that they can reassess your need for medications and monitor your lab values. Today   SUBJECTIVE  Headache Ate OK  VITAL SIGNS:  Blood pressure 127/87, pulse 73, temperature 97.8 F (36.6 C), temperature source Oral, resp. rate 20, height 5\' 4"  (1.626 m), weight 90.6 kg (199 lb 11.8 oz), SpO2 92 %.  I/O:   Intake/Output Summary (Last 24 hours) at 01/31/16 1638 Last data filed at 01/31/16 1240  Gross per 24 hour  Intake              120 ml  Output             3033 ml  Net            -2913 ml    PHYSICAL EXAMINATION:  GENERAL:  35 y.o.-year-old patient lying in the bed with no acute distress.  EYES: Pupils equal, round, reactive to light and accommodation. No scleral icterus. Extraocular muscles intact.  HEENT: Head atraumatic, normocephalic. Oropharynx and nasopharynx clear.  NECK:  Supple, no jugular venous distention. No thyroid enlargement, no tenderness.  LUNGS: Normal breath sounds bilaterally, no wheezing, rales,rhonchi or crepitation. No use of accessory muscles of respiration.  CARDIOVASCULAR: S1, S2 normal. No murmurs, rubs, or gallops.  ABDOMEN: Soft, non-tender, non-distended. Bowel sounds present. No organomegaly or mass.  EXTREMITIES: No pedal edema, cyanosis, or  clubbing.  NEUROLOGIC: Cranial nerves II through XII are intact. Muscle strength 5/5 in all extremities. Sensation intact. Gait not checked.  PSYCHIATRIC: The patient is alert and oriented x 3.  SKIN: No obvious rash, lesion, or ulcer.   DATA REVIEW:   CBC   Recent Labs Lab 01/31/16 0435  WBC 8.7  HGB 7.9*  HCT 24.2*  PLT 269    Chemistries   Recent Labs Lab 01/29/16 1801 01/30/16 0420  NA 136 135  K 6.1* 5.3*  CL 101 101  CO2 17* 17*  GLUCOSE 87 102*  BUN 109* 113*  CREATININE 15.17* 14.86*  CALCIUM 8.0* 8.0*  AST 17  --   ALT 17  --   ALKPHOS 75  --   BILITOT 0.8  --     Microbiology Results   Recent Results (from the past 240 hour(s))  Blood culture (routine x 2)     Status: None (Preliminary result)   Collection Time: 01/29/16  8:18 PM  Result  Value Ref Range Status   Specimen Description BLOOD RIGHT AC  Final   Special Requests   Final    BOTTLES DRAWN AEROBIC AND ANAEROBIC ANA12CC AER11CC   Culture NO GROWTH 2 DAYS  Final   Report Status PENDING  Incomplete  Blood culture (routine x 2)     Status: None (Preliminary result)   Collection Time: 01/29/16  8:18 PM  Result Value Ref Range Status   Specimen Description BLOOD LEFT AC  Final   Special Requests BOTTLES DRAWN AEROBIC AND ANAEROBIC ANA9CC AER11CC  Final   Culture NO GROWTH 2 DAYS  Final   Report Status PENDING  Incomplete  Body fluid culture     Status: None (Preliminary result)   Collection Time: 01/31/16  8:38 AM  Result Value Ref Range Status   Specimen Description PERITONEAL  Final   Special Requests Normal  Final   Gram Stain   Final    NO WBC SEEN NO ORGANISMS SEEN Performed at [facility]    Culture PENDING  Incomplete   Report Status PENDING  Incomplete  Aerobic Culture (superficial specimen)     Status: None (Preliminary result)   Collection Time: 01/31/16  8:50 AM  Result Value Ref Range Status   Specimen Description ABDOMEN  Final   Special Requests Normal  Final    Gram Stain   Final    MODERATE WBC PRESENT, PREDOMINANTLY PMN FEW GRAM POSITIVE COCCI Performed at [facility]    Culture PENDING  Incomplete   Report Status PENDING  Incomplete    RADIOLOGY:  Dg Chest 2 View  Result Date: 01/29/2016 CLINICAL DATA:  Cough and chills for 2 days EXAM: CHEST  2 VIEW COMPARISON:  03/08/2015 FINDINGS: Cardiac shadow is at the upper limits of normal in size. The lungs are well aerated bilaterally and demonstrate some patchy infiltrative changes bilaterally. No sizable effusion is seen. No acute bony abnormality is noted. IMPRESSION: Patchy infiltrates bilaterally slightly worse on the right than the left. Electronically Signed   By: Inez Catalina M.D.   On: 01/29/2016 18:54     Management plans discussed with the patient, family and they are in agreement.  CODE STATUS:     Code Status Orders        Start     Ordered   01/30/16 0012  Full code  Continuous     01/30/16 0011    Code Status History    Date Active Date Inactive Code Status Order ID Comments User Context   This patient has a current code status but no historical code status.      TOTAL TIME TAKING CARE OF THIS PATIENT: 40 minutes.    Mixtli Reno M.D on 01/31/2016 at 4:38 PM  Between 7am to 6pm - Pager - [phone] After 6pm go to [email or web address] - password [email or web address] [facility]  Office  [phone]  CC: Primary care physician; [facility]

## 2016-01-31 NOTE — Progress Notes (Signed)
Subjective:  Patient reports of severe headache Able to eat without nausea or vomiting PD done successfully last night PD nurse reports some redness around the exit site   Objective:  Vital signs in last 24 hours:  Temp:  [97.8 F (36.6 C)-99 F (37.2 C)] 97.8 F (36.6 C) (12/13 1249) Pulse Rate:  [73-128] 73 (12/13 1249) Resp:  [16-24] 20 (12/13 1249) BP: (127-165)/(81-110) 127/87 (12/13 1249) SpO2:  [77 %-100 %] 96 % (12/13 1249) Weight:  [90.6 kg (199 lb 11.8 oz)] 90.6 kg (199 lb 11.8 oz) (12/12 2010)  Weight change: -2.994 kg (-6 lb 9.6 oz) Filed Weights   01/30/16 0248 01/30/16 0852 01/30/16 2010  Weight: 95.7 kg (211 lb) 91.8 kg (202 lb 6.4 oz) 90.6 kg (199 lb 11.8 oz)    Intake/Output:    Intake/Output Summary (Last 24 hours) at 01/31/16 1419 Last data filed at 01/31/16 1240  Gross per 24 hour  Intake              360 ml  Output             3183 ml  Net            -2823 ml     Physical Exam: General: No acute distress, laying in the bed  HEENT Anicteric, moist mucous membranes  Neck supple  Pulm/lungs Normal breathing effort, clear to auscultation bilaterally  CVS/Heart Regular, no rub or gallop  Abdomen:  Soft, nontender,  Extremities: No peripheral edema  Neurologic: Alert, oriented  Skin: Multiple tattoos  Access: PD catheter in place       Basic Metabolic Panel:   Recent Labs Lab 01/29/16 1801 01/30/16 0420  NA 136 135  K 6.1* 5.3*  CL 101 101  CO2 17* 17*  GLUCOSE 87 102*  BUN 109* 113*  CREATININE 15.17* 14.86*  CALCIUM 8.0* 8.0*     CBC:  Recent Labs Lab 01/29/16 1801 01/30/16 1249 01/31/16 0435  WBC 7.4 5.7 8.7  HGB 7.9* 7.9* 7.9*  HCT 23.8* 24.0* 24.2*  MCV 84.8 84.9 85.5  PLT 263 288 269      Microbiology:  Recent Results (from the past 720 hour(s))  Blood culture (routine x 2)     Status: None (Preliminary result)   Collection Time: 01/29/16  8:18 PM  Result Value Ref Range Status   Specimen Description  BLOOD RIGHT AC  Final   Special Requests   Final    BOTTLES DRAWN AEROBIC AND ANAEROBIC ANA12CC AER11CC   Culture NO GROWTH 2 DAYS  Final   Report Status PENDING  Incomplete  Blood culture (routine x 2)     Status: None (Preliminary result)   Collection Time: 01/29/16  8:18 PM  Result Value Ref Range Status   Specimen Description BLOOD LEFT AC  Final   Special Requests BOTTLES DRAWN AEROBIC AND ANAEROBIC ANA9CC AER11CC  Final   Culture NO GROWTH 2 DAYS  Final   Report Status PENDING  Incomplete    Coagulation Studies:  Recent Labs  01/30/16 0420  LABPROT 15.2  INR 1.19    Urinalysis: No results for input(s): COLORURINE, LABSPEC, PHURINE, GLUCOSEU, HGBUR, BILIRUBINUR, KETONESUR, PROTEINUR, UROBILINOGEN, NITRITE, LEUKOCYTESUR in the last 72 hours.  Invalid input(s): APPERANCEUR    Imaging: Dg Chest 2 View  Result Date: 01/29/2016 CLINICAL DATA:  Cough and chills for 2 days EXAM: CHEST  2 VIEW COMPARISON:  03/08/2015 FINDINGS: Cardiac shadow is at the upper limits of normal in size. The lungs  are well aerated bilaterally and demonstrate some patchy infiltrative changes bilaterally. No sizable effusion is seen. No acute bony abnormality is noted. IMPRESSION: Patchy infiltrates bilaterally slightly worse on the right than the left. Electronically Signed   By: Inez Catalina M.D.   On: 01/29/2016 18:54     Medications:    . [START ON 02/01/2016] amLODipine  10 mg Oral Daily  . calcium acetate  1,334 mg Oral TID WC  . dialysis solution 1.5% low-MG/low-CA  2,000 mL Intraperitoneal Q24H  . febuxostat  40 mg Oral Daily  . furosemide  80 mg Oral BID  . gentamicin cream  1 application Topical Daily  . heparin subcutaneous  5,000 Units Subcutaneous Q8H  . levofloxacin  250 mg Oral Daily  . losartan  100 mg Oral Daily  . metoprolol tartrate  25 mg Oral BID  . sodium bicarbonate  650 mg Oral BID  . 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   1. End-stage renal disease Peritoneal dialysis We will continue 5 cycles of 2000 cc  Volume appears to be corrected therefore we will continue to use 1.5% solution Increased lasix to 80 BID  2. Anemia of chronic kidney disease Hemoglobin 7.9 - SQ Procrit  3. Hyperkalemia  Patient was counseled regarding low potassium diet monitor  4. Shortness of breath/Bronchitis Antibiotics as per internal medicine team   LOS: 2 Jaisen Wiltrout 12/13/20172:19 PM

## 2016-02-01 LAB — PATHOLOGIST SMEAR REVIEW

## 2016-02-02 LAB — AEROBIC CULTURE W GRAM STAIN (SUPERFICIAL SPECIMEN): Special Requests: NORMAL

## 2016-02-02 LAB — AEROBIC CULTURE  (SUPERFICIAL SPECIMEN)

## 2016-02-03 LAB — BODY FLUID CULTURE
CULTURE: NO GROWTH
GRAM STAIN: NONE SEEN
SPECIAL REQUESTS: NORMAL

## 2016-02-03 LAB — CULTURE, BLOOD (ROUTINE X 2)
CULTURE: NO GROWTH
Culture: NO GROWTH

## 2016-02-26 ENCOUNTER — Inpatient Hospital Stay
Admission: EM | Admit: 2016-02-26 | Discharge: 2016-03-01 | DRG: 314 | Disposition: A | Discharge status: Home / Self Care | Payer: Commercial Managed Care - PPO | Attending: Internal Medicine | Admitting: Internal Medicine

## 2016-02-26 ENCOUNTER — Encounter: Payer: Self-pay | Admitting: Emergency Medicine

## 2016-02-26 ENCOUNTER — Emergency Department: Payer: Commercial Managed Care - PPO

## 2016-02-26 ENCOUNTER — Observation Stay (HOSPITAL_COMMUNITY)
Admit: 2016-02-26 | Discharge: 2016-02-26 | Disposition: A | Discharge status: Home / Self Care | Payer: Commercial Managed Care - PPO | Attending: Internal Medicine | Admitting: Internal Medicine

## 2016-02-26 DIAGNOSIS — R079 Chest pain, unspecified: Secondary | ICD-10-CM | POA: Diagnosis present

## 2016-02-26 DIAGNOSIS — N186 End stage renal disease: Secondary | ICD-10-CM | POA: Diagnosis present

## 2016-02-26 DIAGNOSIS — Z8249 Family history of ischemic heart disease and other diseases of the circulatory system: Secondary | ICD-10-CM

## 2016-02-26 DIAGNOSIS — F419 Anxiety disorder, unspecified: Secondary | ICD-10-CM | POA: Diagnosis present

## 2016-02-26 DIAGNOSIS — Z7682 Awaiting organ transplant status: Secondary | ICD-10-CM

## 2016-02-26 DIAGNOSIS — L929 Granulomatous disorder of the skin and subcutaneous tissue, unspecified: Secondary | ICD-10-CM | POA: Diagnosis present

## 2016-02-26 DIAGNOSIS — E669 Obesity, unspecified: Secondary | ICD-10-CM | POA: Diagnosis present

## 2016-02-26 DIAGNOSIS — M109 Gout, unspecified: Secondary | ICD-10-CM | POA: Diagnosis present

## 2016-02-26 DIAGNOSIS — Z9884 Bariatric surgery status: Secondary | ICD-10-CM

## 2016-02-26 DIAGNOSIS — I429 Cardiomyopathy, unspecified: Secondary | ICD-10-CM

## 2016-02-26 DIAGNOSIS — D649 Anemia, unspecified: Secondary | ICD-10-CM | POA: Diagnosis present

## 2016-02-26 DIAGNOSIS — I200 Unstable angina: Secondary | ICD-10-CM | POA: Diagnosis present

## 2016-02-26 DIAGNOSIS — Z8711 Personal history of peptic ulcer disease: Secondary | ICD-10-CM

## 2016-02-26 DIAGNOSIS — I151 Hypertension secondary to other renal disorders: Secondary | ICD-10-CM | POA: Diagnosis not present

## 2016-02-26 DIAGNOSIS — R0602 Shortness of breath: Secondary | ICD-10-CM

## 2016-02-26 DIAGNOSIS — E8770 Fluid overload, unspecified: Secondary | ICD-10-CM | POA: Diagnosis present

## 2016-02-26 DIAGNOSIS — E875 Hyperkalemia: Secondary | ICD-10-CM | POA: Diagnosis present

## 2016-02-26 DIAGNOSIS — E8809 Other disorders of plasma-protein metabolism, not elsewhere classified: Secondary | ICD-10-CM | POA: Diagnosis present

## 2016-02-26 DIAGNOSIS — Z992 Dependence on renal dialysis: Secondary | ICD-10-CM

## 2016-02-26 DIAGNOSIS — D631 Anemia in chronic kidney disease: Secondary | ICD-10-CM | POA: Diagnosis present

## 2016-02-26 DIAGNOSIS — I10 Essential (primary) hypertension: Secondary | ICD-10-CM | POA: Diagnosis present

## 2016-02-26 DIAGNOSIS — I120 Hypertensive chronic kidney disease with stage 5 chronic kidney disease or end stage renal disease: Secondary | ICD-10-CM | POA: Diagnosis present

## 2016-02-26 DIAGNOSIS — I308 Other forms of acute pericarditis: Secondary | ICD-10-CM | POA: Diagnosis not present

## 2016-02-26 DIAGNOSIS — G4733 Obstructive sleep apnea (adult) (pediatric): Secondary | ICD-10-CM | POA: Diagnosis present

## 2016-02-26 DIAGNOSIS — I300 Acute nonspecific idiopathic pericarditis: Secondary | ICD-10-CM | POA: Diagnosis not present

## 2016-02-26 DIAGNOSIS — I480 Paroxysmal atrial fibrillation: Secondary | ICD-10-CM | POA: Diagnosis not present

## 2016-02-26 DIAGNOSIS — I4891 Unspecified atrial fibrillation: Secondary | ICD-10-CM | POA: Diagnosis present

## 2016-02-26 DIAGNOSIS — I309 Acute pericarditis, unspecified: Principal | ICD-10-CM | POA: Diagnosis present

## 2016-02-26 DIAGNOSIS — F329 Major depressive disorder, single episode, unspecified: Secondary | ICD-10-CM | POA: Diagnosis present

## 2016-02-26 DIAGNOSIS — Z8701 Personal history of pneumonia (recurrent): Secondary | ICD-10-CM | POA: Diagnosis not present

## 2016-02-26 DIAGNOSIS — Z79899 Other long term (current) drug therapy: Secondary | ICD-10-CM

## 2016-02-26 DIAGNOSIS — Z6835 Body mass index (BMI) 35.0-35.9, adult: Secondary | ICD-10-CM | POA: Diagnosis not present

## 2016-02-26 DIAGNOSIS — Z833 Family history of diabetes mellitus: Secondary | ICD-10-CM

## 2016-02-26 DIAGNOSIS — R0789 Other chest pain: Secondary | ICD-10-CM | POA: Diagnosis not present

## 2016-02-26 HISTORY — DX: End stage renal disease: N18.6

## 2016-02-26 HISTORY — DX: Essential (primary) hypertension: I10

## 2016-02-26 HISTORY — DX: Dependence on renal dialysis: Z99.2

## 2016-02-26 LAB — BASIC METABOLIC PANEL
ANION GAP: 23 — AB (ref 5–15)
ANION GAP: 22 — AB (ref 5–15)
BUN: 127 mg/dL — ABNORMAL HIGH (ref 6–20)
BUN: 132 mg/dL — ABNORMAL HIGH (ref 6–20)
CALCIUM: 6.1 mg/dL — AB (ref 8.9–10.3)
CHLORIDE: 100 mmol/L — AB (ref 101–111)
CO2: 13 mmol/L — ABNORMAL LOW (ref 22–32)
CO2: 15 mmol/L — ABNORMAL LOW (ref 22–32)
Calcium: 6.3 mg/dL — CL (ref 8.9–10.3)
Chloride: 98 mmol/L — ABNORMAL LOW (ref 101–111)
Creatinine, Ser: 16.82 mg/dL — ABNORMAL HIGH (ref 0.61–1.24)
Creatinine, Ser: 17.33 mg/dL — ABNORMAL HIGH (ref 0.61–1.24)
GFR calc Af Amer: 4 mL/min — ABNORMAL LOW (ref >60)
GFR, EST AFRICAN AMERICAN: 4 mL/min — AB (ref >60)
GFR, EST NON AFRICAN AMERICAN: 3 mL/min — AB (ref >60)
GFR, EST NON AFRICAN AMERICAN: 3 mL/min — AB (ref >60)
GLUCOSE: 102 mg/dL — AB (ref 65–99)
Glucose, Bld: 102 mg/dL — ABNORMAL HIGH (ref 65–99)
POTASSIUM: 5.5 mmol/L — AB (ref 3.5–5.1)
POTASSIUM: 6.2 mmol/L — AB (ref 3.5–5.1)
Sodium: 136 mmol/L (ref 135–145)
Sodium: 135 mmol/L (ref 135–145)

## 2016-02-26 LAB — HEPATIC FUNCTION PANEL
ALBUMIN: 2.6 g/dL — AB (ref 3.5–5.0)
ALT: 12 U/L — AB (ref 17–63)
AST: 18 U/L (ref 15–41)
Alkaline Phosphatase: 61 U/L (ref 38–126)
BILIRUBIN DIRECT: 0.1 mg/dL (ref 0.1–0.5)
Indirect Bilirubin: 1.2 mg/dL — ABNORMAL HIGH (ref 0.3–0.9)
Total Bilirubin: 1.3 mg/dL — ABNORMAL HIGH (ref 0.3–1.2)
Total Protein: 7.5 g/dL (ref 6.5–8.1)

## 2016-02-26 LAB — CBC
HEMATOCRIT: 24.3 % — AB (ref 40.0–52.0)
HEMATOCRIT: 22.8 % — AB (ref 40.0–52.0)
HEMOGLOBIN: 8.2 g/dL — AB (ref 13.0–18.0)
HEMOGLOBIN: 7.5 g/dL — AB (ref 13.0–18.0)
MCH: 29.8 pg (ref 26.0–34.0)
MCH: 29.3 pg (ref 26.0–34.0)
MCHC: 33.7 g/dL (ref 32.0–36.0)
MCHC: 33.1 g/dL (ref 32.0–36.0)
MCV: 88.4 fL (ref 80.0–100.0)
MCV: 88.7 fL (ref 80.0–100.0)
Platelets: 317 10*3/uL (ref 150–440)
Platelets: 275 10*3/uL (ref 150–440)
RBC: 2.75 MIL/uL — ABNORMAL LOW (ref 4.40–5.90)
RBC: 2.57 MIL/uL — AB (ref 4.40–5.90)
RDW: 16.2 % — ABNORMAL HIGH (ref 11.5–14.5)
RDW: 16.5 % — ABNORMAL HIGH (ref 11.5–14.5)
WBC: 7.2 10*3/uL (ref 3.8–10.6)
WBC: 7.5 10*3/uL (ref 3.8–10.6)

## 2016-02-26 LAB — TROPONIN I
TROPONIN I: 0.05 ng/mL — AB (ref <0.03)
TROPONIN I: 0.06 ng/mL — AB (ref <0.03)
Troponin I: 0.05 ng/mL (ref <0.03)

## 2016-02-26 LAB — LIPASE, BLOOD: Lipase: 54 U/L — ABNORMAL HIGH (ref 11–51)

## 2016-02-26 LAB — LIPID PANEL
CHOL/HDL RATIO: 3.8 ratio
Cholesterol: 132 mg/dL (ref 0–200)
HDL: 35 mg/dL — AB (ref >40)
LDL Cholesterol: 82 mg/dL (ref 0–99)
TRIGLYCERIDES: 74 mg/dL (ref <150)
VLDL: 15 mg/dL (ref 0–40)

## 2016-02-26 LAB — POTASSIUM: POTASSIUM: 5.5 mmol/L — AB (ref 3.5–5.1)

## 2016-02-26 LAB — ECHOCARDIOGRAM COMPLETE
Height: 64 in
WEIGHTICAEL: 3310.4 [oz_av]

## 2016-02-26 LAB — GLUCOSE, CAPILLARY
GLUCOSE-CAPILLARY: 113 mg/dL — AB (ref 65–99)
GLUCOSE-CAPILLARY: 138 mg/dL — AB (ref 65–99)

## 2016-02-26 MED ORDER — DEXTROSE 50 % IV SOLN
1.0000 | INTRAVENOUS | Status: AC
  Administered 2016-02-26: 50 mL via INTRAVENOUS

## 2016-02-26 MED ORDER — SODIUM POLYSTYRENE SULFONATE 15 GM/60ML PO SUSP
15.0000 g | Freq: Once | ORAL | Status: AC
  Administered 2016-02-26: 15 g via ORAL
  Filled 2016-02-26: qty 60

## 2016-02-26 MED ORDER — ONDANSETRON HCL 4 MG/2ML IJ SOLN
INTRAMUSCULAR | Status: AC
  Filled 2016-02-26: qty 2

## 2016-02-26 MED ORDER — GENTAMICIN SULFATE 0.1 % EX CREA
1.0000 "application " | TOPICAL_CREAM | Freq: Every day | CUTANEOUS | Status: DC
  Administered 2016-02-26 – 2016-02-29 (×3): 1 via TOPICAL
  Filled 2016-02-26: qty 15

## 2016-02-26 MED ORDER — ASPIRIN 300 MG RE SUPP: 300.0000 mg | RECTAL | Status: AC

## 2016-02-26 MED ORDER — NITROGLYCERIN 0.4 MG SL SUBL: 0.4000 mg | SUBLINGUAL_TABLET | SUBLINGUAL | Status: DC | PRN

## 2016-02-26 MED ORDER — NITROGLYCERIN 2 % TD OINT
1.0000 [in_us] | TOPICAL_OINTMENT | Freq: Once | TRANSDERMAL | Status: AC
  Administered 2016-02-26: 1 [in_us] via TOPICAL
  Filled 2016-02-26: qty 1

## 2016-02-26 MED ORDER — INSULIN ASPART 100 UNIT/ML IV SOLN
5.0000 [IU] | INTRAVENOUS | Status: AC
  Administered 2016-02-26: 5 [IU] via INTRAVENOUS
  Filled 2016-02-26: qty 0.05

## 2016-02-26 MED ORDER — MORPHINE SULFATE (PF) 4 MG/ML IV SOLN
4.0000 mg | Freq: Once | INTRAVENOUS | Status: AC
  Administered 2016-02-26: 4 mg via INTRAVENOUS

## 2016-02-26 MED ORDER — HYDROMORPHONE HCL 1 MG/ML IJ SOLN
1.0000 mg | INTRAMUSCULAR | Status: DC | PRN
  Administered 2016-02-26 (×4): 1 mg via INTRAVENOUS
  Filled 2016-02-26 (×4): qty 1

## 2016-02-26 MED ORDER — SODIUM BICARBONATE 8.4 % IV SOLN
50.0000 meq | INTRAVENOUS | Status: DC
  Filled 2016-02-26: qty 50

## 2016-02-26 MED ORDER — HYDROMORPHONE HCL 1 MG/ML IJ SOLN
1.0000 mg | INTRAMUSCULAR | Status: DC | PRN
  Administered 2016-02-26 – 2016-02-27 (×2): 1 mg via INTRAVENOUS
  Filled 2016-02-26 (×2): qty 1

## 2016-02-26 MED ORDER — HEPARIN SODIUM (PORCINE) 5000 UNIT/ML IJ SOLN
5000.0000 [IU] | Freq: Three times a day (TID) | INTRAMUSCULAR | Status: DC
  Administered 2016-02-26 – 2016-03-01 (×13): 5000 [IU] via SUBCUTANEOUS
  Filled 2016-02-26 (×13): qty 1

## 2016-02-26 MED ORDER — ONDANSETRON HCL 4 MG/2ML IJ SOLN
4.0000 mg | Freq: Once | INTRAMUSCULAR | Status: AC
  Administered 2016-02-26: 4 mg via INTRAVENOUS

## 2016-02-26 MED ORDER — SODIUM BICARBONATE 650 MG PO TABS
650.0000 mg | ORAL_TABLET | Freq: Two times a day (BID) | ORAL | Status: DC
  Administered 2016-02-26 – 2016-03-01 (×9): 650 mg via ORAL
  Filled 2016-02-26 (×9): qty 1

## 2016-02-26 MED ORDER — FEBUXOSTAT 40 MG PO TABS
40.0000 mg | ORAL_TABLET | Freq: Every day | ORAL | Status: DC
  Administered 2016-02-26 – 2016-03-01 (×5): 40 mg via ORAL
  Filled 2016-02-26 (×5): qty 1

## 2016-02-26 MED ORDER — DELFLEX-LC/2.5% DEXTROSE 394 MOSM/L IP SOLN
INTRAPERITONEAL | Status: DC
  Administered 2016-02-26 – 2016-02-28 (×3): via INTRAPERITONEAL
  Filled 2016-02-26: qty 3000

## 2016-02-26 MED ORDER — SODIUM CHLORIDE 0.9% FLUSH
3.0000 mL | INTRAVENOUS | Status: DC | PRN
  Administered 2016-02-26 (×2): 3 mL via INTRAVENOUS
  Filled 2016-02-26 (×2): qty 3

## 2016-02-26 MED ORDER — CALCIUM ACETATE (PHOS BINDER) 667 MG PO CAPS
1334.0000 mg | ORAL_CAPSULE | Freq: Three times a day (TID) | ORAL | Status: DC
  Administered 2016-02-26 – 2016-03-01 (×11): 1334 mg via ORAL
  Filled 2016-02-26 (×12): qty 2

## 2016-02-26 MED ORDER — ONDANSETRON HCL 4 MG/2ML IJ SOLN
4.0000 mg | Freq: Four times a day (QID) | INTRAMUSCULAR | Status: DC | PRN
  Administered 2016-02-26 – 2016-02-29 (×3): 4 mg via INTRAVENOUS
  Filled 2016-02-26 (×3): qty 2

## 2016-02-26 MED ORDER — ASPIRIN 81 MG PO CHEW
324.0000 mg | CHEWABLE_TABLET | Freq: Once | ORAL | Status: AC
  Administered 2016-02-26: 324 mg via ORAL
  Filled 2016-02-26: qty 4

## 2016-02-26 MED ORDER — ASPIRIN 81 MG PO CHEW
324.0000 mg | CHEWABLE_TABLET | ORAL | Status: AC
Start: 2016-02-26 — End: 2016-02-26
  Administered 2016-02-26: 324 mg via ORAL
  Filled 2016-02-26: qty 4

## 2016-02-26 MED ORDER — SODIUM CHLORIDE 0.9% FLUSH
3.0000 mL | Freq: Two times a day (BID) | INTRAVENOUS | Status: DC
  Administered 2016-02-26 – 2016-03-01 (×9): 3 mL via INTRAVENOUS

## 2016-02-26 MED ORDER — AMLODIPINE BESYLATE 5 MG PO TABS
5.0000 mg | ORAL_TABLET | Freq: Every day | ORAL | Status: DC
  Administered 2016-02-26 – 2016-03-01 (×5): 5 mg via ORAL
  Filled 2016-02-26 (×5): qty 1

## 2016-02-26 MED ORDER — DEXTROSE 50 % IV SOLN
INTRAVENOUS | Status: AC
  Administered 2016-02-26: 50 mL via INTRAVENOUS
  Filled 2016-02-26: qty 50

## 2016-02-26 MED ORDER — PANTOPRAZOLE SODIUM 40 MG PO TBEC
40.0000 mg | DELAYED_RELEASE_TABLET | Freq: Every day | ORAL | Status: DC
  Administered 2016-02-27 – 2016-03-01 (×4): 40 mg via ORAL
  Filled 2016-02-26 (×4): qty 1

## 2016-02-26 MED ORDER — SODIUM CHLORIDE 0.9 % IV SOLN
250.0000 mL | INTRAVENOUS | Status: DC | PRN
Start: 2016-02-26 — End: 2016-03-01

## 2016-02-26 MED ORDER — SODIUM BICARBONATE 8.4 % IV SOLN: 50.0000 meq | Freq: Once | INTRAVENOUS | Status: DC

## 2016-02-26 MED ORDER — MORPHINE SULFATE (PF) 4 MG/ML IV SOLN
INTRAVENOUS | Status: AC
  Filled 2016-02-26: qty 1

## 2016-02-26 MED ORDER — VITAMIN D (ERGOCALCIFEROL) 1.25 MG (50000 UNIT) PO CAPS
50000.0000 [IU] | ORAL_CAPSULE | ORAL | Status: DC
  Administered 2016-02-28: 50000 [IU] via ORAL
  Filled 2016-02-26: qty 1

## 2016-02-26 MED ORDER — ASPIRIN EC 81 MG PO TBEC
81.0000 mg | DELAYED_RELEASE_TABLET | Freq: Every day | ORAL | Status: DC
  Administered 2016-02-27 – 2016-03-01 (×4): 81 mg via ORAL
  Filled 2016-02-26 (×4): qty 1

## 2016-02-26 MED ORDER — PANTOPRAZOLE SODIUM 40 MG IV SOLR
40.0000 mg | Freq: Two times a day (BID) | INTRAVENOUS | Status: DC
  Administered 2016-02-26: 40 mg via INTRAVENOUS
  Filled 2016-02-26: qty 40

## 2016-02-26 MED ORDER — METOPROLOL TARTRATE 25 MG PO TABS
25.0000 mg | ORAL_TABLET | Freq: Two times a day (BID) | ORAL | Status: DC
Start: 2016-02-26 — End: 2016-03-01
  Administered 2016-02-26 – 2016-03-01 (×9): 25 mg via ORAL
  Filled 2016-02-26 (×9): qty 1

## 2016-02-26 MED ORDER — ACETAMINOPHEN 325 MG PO TABS
650.0000 mg | ORAL_TABLET | ORAL | Status: DC | PRN
  Administered 2016-03-01: 650 mg via ORAL
  Filled 2016-02-26: qty 2

## 2016-02-26 MED ORDER — LOSARTAN POTASSIUM 50 MG PO TABS
100.0000 mg | ORAL_TABLET | Freq: Every day | ORAL | Status: DC
  Administered 2016-02-27 – 2016-03-01 (×4): 100 mg via ORAL
  Filled 2016-02-26 (×5): qty 2

## 2016-02-26 MED ORDER — FUROSEMIDE 40 MG PO TABS
40.0000 mg | ORAL_TABLET | Freq: Every day | ORAL | Status: DC
  Administered 2016-02-26 – 2016-03-01 (×5): 40 mg via ORAL
  Filled 2016-02-26 (×5): qty 1

## 2016-02-26 MED ORDER — ALBUTEROL SULFATE (2.5 MG/3ML) 0.083% IN NEBU: 3.0000 mL | INHALATION_SOLUTION | Freq: Four times a day (QID) | RESPIRATORY_TRACT | Status: DC | PRN

## 2016-02-26 MED ORDER — PREDNISONE 50 MG PO TABS
50.0000 mg | ORAL_TABLET | Freq: Every day | ORAL | Status: DC
  Administered 2016-02-26 – 2016-03-01 (×5): 50 mg via ORAL
  Filled 2016-02-26 (×5): qty 1

## 2016-02-26 NOTE — Progress Notes (Signed)
CRITICAL VALUE ALERT  Critical value received:  Calcium 6.1 Date of notification: 02/26/16  Time of notification:  0735  Critical value read back : yes Nurse who received alert:  Lennart Pall MD notified (1st page):  Dr. Vianne Bulls, spoke to her directly on unit  Time of first page: 0742  MD notified (2nd page):  Time of second page:  Responding MD: Vianne Bulls Time MD responded:  [phone]

## 2016-02-26 NOTE — Consult Note (Signed)
CARDIOLOGY CONSULT NOTE  Patient ID: Louis Lee MRN: [id number] DOB/AGE: July 24, 1980 36 y.o.  Admit date: 02/26/2016 Referring Physician:  Dr. Estanislado Pandy  Primary Physician: [facility] Primary Cardiologist :New Reason for Consultation : Chest pain.   Date:  02/26/2016    Chief Complaint  Patient presents with  . Chest Pain      History of Present Illness: Louis Lee is a 36 y.o. male who presented with severe chest pain.He has no previous cardiac history. He has known history of end-stage renal disease on peritoneal dialysis every other day and hypertension. 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 He has other chronic medical conditions that include history of gastric bypass, duodenal ulcer, history of GI bleed in 2015 and anemia of chronic kidney disease. He is currently on the kidney transplant list at [facility] and has undergone a nuclear stress test in early 2017 which was overall low risk with an ejection fraction of 52%. The patient started having chest pain yesterday. The pain was substernal and it was severe. It was described as pressure and tightness with some radiation to his shoulders. The pain was worse with lying down and breathing. It was also worse with certain movements. It was associated with significant shortness of breath and feeling extremely uncomfortable. The pain has been almost continuous since yesterday although it has improved some with Dilaudid. The patient was noted to be hyperkalemic. Troponin was borderline at 0.05. The patient was hospitalized here last month for pneumonia.     Past Medical History:  Diagnosis Date  . ESRD on dialysis ([place])   . Hypertension   . Renal disorder    pt is listed for kidney transplant    Past Surgical History:  Procedure Laterality Date  . GASTRIC BYPASS       Current Facility-Administered Medications  Medication Dose Route Frequency Provider Last Rate Last Dose  . 0.9 %  sodium chloride  infusion  250 mL Intravenous PRN Saundra Shelling, MD      . acetaminophen (TYLENOL) tablet 650 mg  650 mg Oral Q4H PRN Pavan Pyreddy, MD      . albuterol (PROVENTIL) (2.5 MG/3ML) 0.083% nebulizer solution 3 mL  3 mL Inhalation Q6H PRN Pavan Pyreddy, MD      . amLODipine (NORVASC) tablet 5 mg  5 mg Oral Daily Saundra Shelling, MD      . Derrill Memo ON 02/27/2016] aspirin EC tablet 81 mg  81 mg Oral Daily Pavan Pyreddy, MD      . calcium acetate (PHOSLO) capsule 1,334 mg  1,334 mg Oral TID WC Pavan Pyreddy, MD      . dextrose 50 % solution 50 mL  1 ampule Intravenous STAT Epifanio Lesches, MD      . dextrose 50 % solution           . dialysis solution 2.5% low-MG/low-CA dianeal solution   Intraperitoneal Q24H Munsoor Lateef, MD      . febuxostat (ULORIC) tablet 40 mg  40 mg Oral Daily Pavan Pyreddy, MD      . furosemide (LASIX) tablet 40 mg  40 mg Oral Daily Pavan Pyreddy, MD      . gentamicin cream (GARAMYCIN) 0.1 % 1 application  1 application Topical Daily Munsoor Lateef, MD      . heparin injection 5,000 Units  5,000 Units Subcutaneous Q8H Saundra Shelling, MD   5,000 Units at 02/26/16 0630  . HYDROmorphone (DILAUDID) injection 1 mg  1 mg Intravenous Q3H PRN  Saundra Shelling, MD   1 mg at 02/26/16 0517  . insulin aspart (novoLOG) injection 5 Units  5 Units Intravenous STAT Epifanio Lesches, MD      . losartan (COZAAR) tablet 100 mg  100 mg Oral Daily Pavan Pyreddy, MD      . metoprolol tartrate (LOPRESSOR) tablet 25 mg  25 mg Oral BID Saundra Shelling, MD      . nitroGLYCERIN (NITROSTAT) SL tablet 0.4 mg  0.4 mg Sublingual Q5 Min x 3 PRN Saundra Shelling, MD      . ondansetron (ZOFRAN) injection 4 mg  4 mg Intravenous Q6H PRN Pavan Pyreddy, MD      . pantoprazole (PROTONIX) injection 40 mg  40 mg Intravenous Q12H Epifanio Lesches, MD      . predniSONE (DELTASONE) tablet 50 mg  50 mg Oral Q breakfast Wellington Hampshire, MD      . sodium bicarbonate injection 50 mEq  50 mEq Intravenous STAT Epifanio Lesches,  MD      . sodium bicarbonate tablet 650 mg  650 mg Oral BID Pavan Pyreddy, MD      . sodium chloride flush (NS) 0.9 % injection 3 mL  3 mL Intravenous Q12H Pavan Pyreddy, MD      . sodium chloride flush (NS) 0.9 % injection 3 mL  3 mL Intravenous PRN Saundra Shelling, MD      . Derrill Memo ON 02/28/2016] Vitamin D (Ergocalciferol) (DRISDOL) capsule 50,000 Units  50,000 Units Oral Weekly Saundra Shelling, MD        Allergies:   Patient has no known allergies.    Social History:  The patient  reports that he has never smoked. He has never used smokeless tobacco. He reports that he does not drink alcohol or use drugs.   Family History:  The patient's family history includes Diabetes in his father; Heart disease in his father; Hypertension in his father.    ROS:  Please see the history of present illness.   Otherwise, review of systems are positive for none.   All other systems are reviewed and negative.    PHYSICAL EXAM: VS:  BP 134/82   Pulse 87   Temp 98.5 F (36.9 C) (Oral)   Resp 20   Ht 5\' 4"  (1.626 m)   Wt 206 lb 14.4 oz (93.8 kg)   SpO2 98%   BMI 35.51 kg/m  , BMI Body mass index is 35.51 kg/m. GEN: Well nourished, well developed, in no acute distress  HEENT: normal  Neck: no JVD, carotid bruits, or masses Cardiac: RRR; no murmur or gallops,no edema . His heart sounds are distant but he likely has pericardial rub.  Respiratory:  clear to auscultation bilaterally, normal work of breathing GI: soft, nontender, nondistended, + BS MS: no deformity or atrophy  Skin: warm and dry, no rash Neuro:  Strength and sensation are intact Psych: euthymic mood, full affect   EKG:   Personally reviewed by me and showed: Normal sinus rhythm with lateral T-wave changes which are new compared to prior EKG.  Telemetry recently reviewed by me and showed: Normal sinus rhythm.  Recent Labs: 01/29/2016: B Natriuretic Peptide 1,283.0 02/26/2016: ALT 12; BUN 132; Creatinine, Ser 17.33; Hemoglobin 7.5;  Platelets 275; Potassium 6.2; Sodium 135    Lipid Panel    Component Value Date/Time   CHOL 132 02/26/2016 0639   TRIG 74 02/26/2016 0639   HDL 35 (L) 02/26/2016 0639   CHOLHDL 3.8 02/26/2016 0639   VLDL 15 02/26/2016  [place] 02/26/2016 0639      Wt Readings from Last 3 Encounters:  02/26/16 206 lb 14.4 oz (93.8 kg)  01/30/16 199 lb 11.8 oz (90.6 kg)  03/08/15 190 lb (86.2 kg)      Other studies Reviewed: Additional studies/ records that were reviewed today include: previous records from [facility].    ASSESSMENT AND PLAN:  1.  Chest pain likely due to acute pericarditis: The patient's quality of chest pain is highly suggestive of pericarditis with possible pericardial rub. His symptoms are not consistent with myocardial infarction. The etiology of this could be viral given his recent illness or could be due to underlying end stage renal disease with under-dialyzing. His creatinine and BUN are extremely high with hyperkalemia. Recommend nephrology consultation to address this. In terms of management of pericarditis, given his renal disease and hyperkalemia, it's difficult to give him NSAIDs for colchicine. Thus, I think the best option is short-term steroids and thus I started him on prednisone 50 mg once daily. An echocardiogram was ordered and is pending at the current time.  2. End-stage renal disease on peritoneal dialysis: Likely under dialyzed. Recommend nephrology consultation.  3. Essential hypertension: Blood pressure is reasonably controlled.   Signed,  Kathlyn Sacramento, MD  02/26/2016 8:45 AM    [place]

## 2016-02-26 NOTE — H&P (Signed)
[place] [facility] [place] NAME: Louis Lee    MR#:  [id number]  DATE OF BIRTH:  [birth year]  DATE OF ADMISSION:  02/26/2016  PRIMARY CARE PHYSICIAN: [facility]   REQUESTING/REFERRING PHYSICIAN:   CHIEF COMPLAINT:   Chief Complaint  Patient presents with  . Chest Pain    HISTORY OF PRESENT ILLNESS: Louis Lee  is a 36 y.o. male with a known history of Kidney disease on peritoneal dialysis every alternate day, hypertension presented to the emergency room with chest pain. Patient woke up early this morning with chest pain located on the left side of the chest. Pain is sharp in nature 10 out of 10 on a scale of 1-10 and radiates to the left arm. Patient gets peritoneal dialysis every alternate day. He is on a waiting list for kidney transplant. First set of troponin is 0.05. EKG showed T inversions in the lateral leads. No prior cardiac history. No complaints of any abdominal pain. Patient has nausea but no vomiting. He was given IV morphine but still the pain persisted. Hospitalist service was consulted for further care of the patient.  PAST MEDICAL HISTORY:   Past Medical History:  Diagnosis Date  . ESRD on dialysis ([place])   . Hypertension   . Renal disorder    pt is listed for kidney transplant    PAST SURGICAL HISTORY: Past Surgical History:  Procedure Laterality Date  . GASTRIC BYPASS      SOCIAL HISTORY:  Social History  Substance Use Topics  . Smoking status: Never Smoker  . Smokeless tobacco: Never Used  . Alcohol use No    FAMILY HISTORY:  Family History  Problem Relation Age of Onset  . Heart disease Father   . Diabetes Father   . Hypertension Father     DRUG ALLERGIES: No Known Allergies  REVIEW OF SYSTEMS:   CONSTITUTIONAL: No fever, fatigue or weakness.  EYES: No blurred or double vision.  EARS, NOSE, AND THROAT: No tinnitus or ear pain.  RESPIRATORY: No cough, shortness of breath, wheezing or  hemoptysis.  CARDIOVASCULAR: Has chest pain, no orthopnea, edema.  GASTROINTESTINAL: Has nausea, no vomiting, diarrhea or abdominal pain.  GENITOURINARY: No dysuria, hematuria.  ENDOCRINE: No polyuria, nocturia,  HEMATOLOGY: No anemia, easy bruising or bleeding SKIN: No rash or lesion. MUSCULOSKELETAL: No joint pain or arthritis.   NEUROLOGIC: No tingling, numbness, weakness.  PSYCHIATRY: No anxiety or depression.   MEDICATIONS AT HOME:  Prior to Admission medications   Medication Sig Start Date End Date Taking? Authorizing Provider  albuterol (PROVENTIL HFA;VENTOLIN HFA) 108 (90 Base) MCG/ACT inhaler Inhale 2 puffs into the lungs every 6 (six) hours as needed. 03/26/13  Yes Historical Provider, MD  amLODipine (NORVASC) 5 MG tablet Take 1 tablet by mouth daily. 12/18/15  Yes Historical Provider, MD  calcium acetate (PHOSLO) 667 MG capsule Take 1,334 mg by mouth 3 (three) times daily with meals. Take 2 tablets (1,337mg ) with each meal and between meals for total of 3 times per day. 12/21/15  Yes Historical Provider, MD  febuxostat (ULORIC) 40 MG tablet Take 1 tablet by mouth daily. 07/20/15  Yes Historical Provider, MD  furosemide (LASIX) 40 MG tablet Take 1 tablet by mouth daily. 10/18/15  Yes Historical Provider, MD  losartan (COZAAR) 100 MG tablet Take 100 mg by mouth daily. 01/03/16 01/02/17 Yes Historical Provider, MD  metoprolol tartrate (LOPRESSOR) 25 MG tablet Take 1 tablet (25 mg total) by mouth 2 (  two) times daily. 01/31/16  Yes Fritzi Mandes, MD  sodium bicarbonate 650 MG tablet Take 650 mg by mouth 2 (two) times daily. 12/06/15  Yes Historical Provider, MD  Vitamin D, Ergocalciferol, (DRISDOL) 50000 units CAPS capsule Take 1 capsule by mouth once a week. wed 12/18/15  Yes Historical Provider, MD      PHYSICAL EXAMINATION:   VITAL SIGNS: Blood pressure (!) 136/93, pulse 83, temperature 98 F (36.7 C), temperature source Oral, resp. rate (!) 26, height 5\' 4"  (1.626 m), weight 86.2 kg  (190 lb), SpO2 99 %.  GENERAL:  36 y.o.-year-old patient lying in the bed with no acute distress.  EYES: Pupils equal, round, reactive to light and accommodation. No scleral icterus. Extraocular muscles intact.  HEENT: Head atraumatic, normocephalic. Oropharynx and nasopharynx clear.  NECK:  Supple, no jugular venous distention. No thyroid enlargement, no tenderness.  LUNGS: Normal breath sounds bilaterally, no wheezing, rales,rhonchi or crepitation. No use of accessory muscles of respiration.  CARDIOVASCULAR: S1, S2 normal. No murmurs, rubs, or gallops.  ABDOMEN: Soft, nontender, nondistended. Bowel sounds present. No organomegaly or mass.  Peritoneal dialysis access site bandage noted. EXTREMITIES: No pedal edema, cyanosis, or clubbing.  NEUROLOGIC: Cranial nerves II through XII are intact. Muscle strength 5/5 in all extremities. Sensation intact. Gait not checked.  PSYCHIATRIC: The patient is alert and oriented x 3.  SKIN: No obvious rash, lesion, or ulcer.   LABORATORY PANEL:   CBC  Recent Labs Lab 02/26/16 0314  WBC 7.2  HGB 8.2*  HCT 24.3*  PLT 317  MCV 88.4  MCH 29.8  MCHC 33.7  RDW 16.2*   ------------------------------------------------------------------------------------------------------------------  Chemistries   Recent Labs Lab 02/26/16 0314  NA 136  K 5.5*  CL 100*  CO2 13*  GLUCOSE 102*  BUN 127*  CREATININE 16.82*  CALCIUM 6.3*  AST 18  ALT 12*  ALKPHOS 61  BILITOT 1.3*   ------------------------------------------------------------------------------------------------------------------ estimated creatinine clearance is 6 mL/min (by C-G formula based on SCr of 16.82 mg/dL (H)). ------------------------------------------------------------------------------------------------------------------ No results for input(s): TSH, T4TOTAL, T3FREE, THYROIDAB in the last 72 hours.  Invalid input(s): FREET3   Coagulation profile No results for input(s):  INR, PROTIME in the last 168 hours. ------------------------------------------------------------------------------------------------------------------- No results for input(s): DDIMER in the last 72 hours. -------------------------------------------------------------------------------------------------------------------  Cardiac Enzymes  Recent Labs Lab 02/26/16 0314  TROPONINI 0.05*   ------------------------------------------------------------------------------------------------------------------ Invalid input(s): POCBNP  ---------------------------------------------------------------------------------------------------------------  Urinalysis No results found for: COLORURINE, APPEARANCEUR, LABSPEC, PHURINE, GLUCOSEU, HGBUR, BILIRUBINUR, KETONESUR, PROTEINUR, UROBILINOGEN, NITRITE, LEUKOCYTESUR   RADIOLOGY: Dg Chest Port 1 View  Result Date: 02/26/2016 CLINICAL DATA:  Acute onset of left-sided chest pain and nausea. Shortness of breath. Initial encounter. EXAM: PORTABLE CHEST 1 VIEW COMPARISON:  Chest radiograph performed 01/29/2016 FINDINGS: The lungs are mildly hypoexpanded. Vascular congestion is noted. Increased interstitial markings raise concern for pulmonary edema. No definite pleural effusion or pneumothorax is seen. The cardiomediastinal silhouette is mildly enlarged. No acute osseous abnormalities are seen. IMPRESSION: Lungs mildly hypoexpanded. Vascular congestion and mild cardiomegaly. Increased interstitial markings raise concern for pulmonary edema. Electronically Signed   By: Garald Balding M.D.   On: 02/26/2016 03:59    EKG: Orders placed or performed during the hospital encounter of 02/26/16  . EKG 12-Lead  . EKG 12-Lead  . ED EKG within 10 minutes  . ED EKG within 10 minutes    IMPRESSION AND PLAN: 36 year old male patient with history of end-stage renal disease on peritoneal dialysis, hypertension presented to emergency room with chest discomfort and  pain. Admitting diagnosis 1. Unstable angina 2. End-stage renal disease on peritoneal dialysis 3. Hyperkalemia 4. Hypertension 5. Anemia of chronic disease Treatment plan Admit patient to telemetry observation bed Start patient on aspirin and beta blocker Nitrates when necessary for chest pain DVT prophylaxis subcutaneous heparin IV Dilaudid as needed for pain, morphine does not help the patient Follow-up renal function Oral Kayexalate for hyperkalemia Cardiac consultation Check echocardiogram.  All the records are reviewed and case discussed with ED provider. Management plans discussed with the patient, family and they are in agreement.  CODE STATUS:FULL CODE Code Status History    Date Active Date Inactive Code Status Order ID Comments User Context   01/30/2016 12:11 AM 01/31/2016  9:41 PM Full Code [id number]  Fritzi Mandes, MD Inpatient       TOTAL TIME TAKING CARE OF THIS PATIENT: 50 minutes.    Saundra Shelling M.D on 02/26/2016 at 4:48 AM  Between 7am to 6pm - Pager - [phone]  After 6pm go to [email or web address] - password [email or web address] [place] [facility]  Office  [phone]  CC: Primary care physician; [facility]

## 2016-02-26 NOTE — Progress Notes (Signed)
[facility]  ROUNDING NOTE   Subjective:  Patient well known to Louis Lee from prior hospitalization. He comes back with shortness of breath and chest pain. He has been evaluated by cardiology and it is felt clinically that he may have pericarditis. BUN and creatinine noted to be quite high. Patient performs dialysis 3 times per week. He likely has had loss of residual renal function over time.   Objective:  Vital signs in last 24 hours:  Temp:  [98 F (36.7 C)-98.5 F (36.9 C)] 98 F (36.7 C) (01/08 1143) Pulse Rate:  [81-91] 86 (01/08 1143) Resp:  [17-26] 20 (01/08 1143) BP: (103-161)/(74-106) 131/74 (01/08 1143) SpO2:  [91 %-100 %] 91 % (01/08 1143) Weight:  [86.2 kg (190 lb)-93.8 kg (206 lb 14.4 oz)] 93.8 kg (206 lb 14.4 oz) (01/08 0610)  Weight change:  Filed Weights   02/26/16 0308 02/26/16 0610  Weight: 86.2 kg (190 lb) 93.8 kg (206 lb 14.4 oz)    Intake/Output: No intake/output data recorded.   Intake/Output this shift:  Total I/O In: 240 [P.O.:240] Out: -   Physical Exam: General: No acute distress  Head: Normocephalic, atraumatic. Moist oral mucosal membranes  Eyes: Anicteric  Neck: Supple, trachea midline  Lungs:  Basilar rales, normal effort  Heart: S1S2 faint pericardial friction rub  Abdomen:  Soft, nontender, bowel sounds present  Extremities: 1+ peripheral edema.  Neurologic: Nonfocal, moving all four extremities  Skin: No lesions  Access: PD catheter in place    Basic Metabolic Panel:  Recent Labs Lab 02/26/16 0314 02/26/16 0639 02/26/16 1001  NA 136 135  --   K 5.5* 6.2* 5.5*  CL 100* 98*  --   CO2 13* 15*  --   GLUCOSE 102* 102*  --   BUN 127* 132*  --   CREATININE 16.82* 17.33*  --   CALCIUM 6.3* 6.1*  --     Liver Function Tests:  Recent Labs Lab 02/26/16 0314  AST 18  ALT 12*  ALKPHOS 61  BILITOT 1.3*  PROT 7.5  ALBUMIN 2.6*    Recent Labs Lab 02/26/16 0314  LIPASE 54*   No results for input(s):  AMMONIA in the last 168 hours.  CBC:  Recent Labs Lab 02/26/16 0314 02/26/16 0639  WBC 7.2 7.5  HGB 8.2* 7.5*  HCT 24.3* 22.8*  MCV 88.4 88.7  PLT 317 275    Cardiac Enzymes:  Recent Labs Lab 02/26/16 0314 02/26/16 0639 02/26/16 1001 02/26/16 1555  TROPONINI 0.05* 0.05* 0.06* 0.06*    BNP: Invalid input(s): POCBNP  CBG:  Recent Labs Lab 02/26/16 1212 02/26/16 1636  GLUCAP 113* 138*    Microbiology: Results for orders placed or performed during the hospital encounter of 01/29/16  Blood culture (routine x 2)     Status: None   Collection Time: 01/29/16  8:18 PM  Result Value Ref Range Status   Specimen Description BLOOD RIGHT [facility]  Final   Special Requests   Final    BOTTLES DRAWN AEROBIC AND ANAEROBIC ANA12CC [id number]   Culture NO GROWTH 5 DAYS  Final   Report Status 02/03/2016 FINAL  Final  Blood culture (routine x 2)     Status: None   Collection Time: 01/29/16  8:18 PM  Result Value Ref Range Status   Specimen Description BLOOD LEFT AC  Final   Special Requests BOTTLES DRAWN AEROBIC AND ANAEROBIC [facility] [id number]  Final   Culture NO GROWTH 5 DAYS  Final   Report Status 02/03/2016  FINAL  Final  Body fluid culture     Status: None   Collection Time: 01/31/16  8:38 AM  Result Value Ref Range Status   Specimen Description PERITONEAL  Final   Special Requests Normal  Final   Gram Stain NO WBC SEEN NO ORGANISMS SEEN   Final   Culture   Final    NO GROWTH 3 DAYS Performed at [facility]    Report Status 02/03/2016 FINAL  Final  Aerobic Culture (superficial specimen)     Status: None   Collection Time: 01/31/16  8:50 AM  Result Value Ref Range Status   Specimen Description ABDOMEN  Final   Special Requests Normal  Final   Gram Stain   Final    MODERATE WBC PRESENT, PREDOMINANTLY PMN FEW GRAM POSITIVE COCCI Performed at [place]  Final   Report Status 02/02/2016 FINAL  Final   Organism  ID, Bacteria STAPHYLOCOCCUS AUREUS  Final      Susceptibility   Staphylococcus aureus - MIC*    CIPROFLOXACIN >=8 RESISTANT Resistant     ERYTHROMYCIN <=0.25 SENSITIVE Sensitive     GENTAMICIN <=0.5 SENSITIVE Sensitive     OXACILLIN 0.5 SENSITIVE Sensitive     TETRACYCLINE <=1 SENSITIVE Sensitive     VANCOMYCIN <=0.5 SENSITIVE Sensitive     TRIMETH/SULFA <=10 SENSITIVE Sensitive     CLINDAMYCIN <=0.25 SENSITIVE Sensitive     RIFAMPIN <=0.5 SENSITIVE Sensitive     Inducible Clindamycin NEGATIVE Sensitive     * MODERATE STAPHYLOCOCCUS AUREUS    Coagulation Studies: No results for input(s): LABPROT, INR in the last 72 hours.  Urinalysis: No results for input(s): COLORURINE, LABSPEC, PHURINE, GLUCOSEU, HGBUR, BILIRUBINUR, KETONESUR, PROTEINUR, UROBILINOGEN, NITRITE, LEUKOCYTESUR in the last 72 hours.  Invalid input(s): APPERANCEUR    Imaging: Dg Chest Port 1 View  Result Date: 02/26/2016 CLINICAL DATA:  Acute onset of left-sided chest pain and nausea. Shortness of breath. Initial encounter. EXAM: PORTABLE CHEST 1 VIEW COMPARISON:  Chest radiograph performed 01/29/2016 FINDINGS: The lungs are mildly hypoexpanded. Vascular congestion is noted. Increased interstitial markings raise concern for pulmonary edema. No definite pleural effusion or pneumothorax is seen. The cardiomediastinal silhouette is mildly enlarged. No acute osseous abnormalities are seen. IMPRESSION: Lungs mildly hypoexpanded. Vascular congestion and mild cardiomegaly. Increased interstitial markings raise concern for pulmonary edema. Electronically Signed   By: Garald Balding M.D.   On: 02/26/2016 03:59     Medications:    . amLODipine  5 mg Oral Daily  . [START ON 02/27/2016] aspirin EC  81 mg Oral Daily  . calcium acetate  1,334 mg Oral TID WC  . dialysis solution 2.5% low-MG/low-CA   Intraperitoneal Q24H  . febuxostat  40 mg Oral Daily  . furosemide  40 mg Oral Daily  . gentamicin cream  1 application Topical Daily   . heparin  5,000 Units Subcutaneous Q8H  . losartan  100 mg Oral Daily  . metoprolol tartrate  25 mg Oral BID  . [START ON 02/27/2016] pantoprazole  40 mg Oral Daily  . predniSONE  50 mg Oral Q breakfast  . sodium bicarbonate  650 mg Oral BID  . sodium chloride flush  3 mL Intravenous Q12H  . [START ON 02/28/2016] Vitamin D (Ergocalciferol)  50,000 Units Oral Weekly   sodium chloride, acetaminophen, albuterol, HYDROmorphone (DILAUDID) injection, nitroGLYCERIN, ondansetron (ZOFRAN) IV, sodium chloride flush  Assessment/ Plan:  36 y.o. 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   [facility]/PD  1. End-stage renal disease on PD. 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  BUN/Cr quite high. -Will intensify PD, have advised pt that he will need to perform this daily.  He works third shift at the [employer] and performs PD during the day.   2. Anemia of chronic kidney disease Hgb quite low at 7.5.  Will consider giving epogen while here.  Consider blood transfusion for hgb of 7 or less.   3. Hyperkalemia  Potassium was high at 6.2 this AM, down to 5.5 with correction measures, will perform PD tonight to get down further.  4. Shortness of breath/chest pain:  Suspect possible uremic pericarditis, will need to intensify PD as above.     LOS: 0 Louis Lee 1/8/20185:30 PM

## 2016-02-26 NOTE — ED Provider Notes (Signed)
[facility] Emergency Department Provider Note   ____________________________________________   First MD Initiated Contact with Patient 02/26/16 [phone]     (approximate)  I have reviewed the triage vital signs and the nursing notes.   HISTORY  Chief Complaint Chest Pain    HPI Louis Lee is a 36 y.o. male who presents to the ED from home with a chief complaint of chest pain.Patient has a history of CKD stage IV awaiting organ transplant who was awakened from sleep 2 hours ago with left-sided chest pressure. He was on peritoneal dialysis at the time. Reports left chest pressure associated with nausea and radiation to his left arm. Denies diaphoresis, vomiting, shortness of breath, palpitations or dizziness. Denies recent fever, chills, abdominal pain, diarrhea. Reports pneumonia several weeks ago. Denies recent travel trauma. Nothing makes his symptoms better or worse.   Past Medical History:  Diagnosis Date  . Renal disorder    pt is listed for kidney transplant    Patient Active Problem List   Diagnosis Date Noted  . SOB (shortness of breath) on exertion 01/29/2016    Past Surgical History:  Procedure Laterality Date  . GASTRIC BYPASS      Prior to Admission medications   Medication Sig Start Date End Date Taking? Authorizing Provider  albuterol (PROVENTIL HFA;VENTOLIN HFA) 108 (90 Base) MCG/ACT inhaler Inhale 2 puffs into the lungs every 6 (six) hours as needed. 03/26/13   Historical Provider, MD  amLODipine (NORVASC) 5 MG tablet Take 1 tablet by mouth daily. 12/18/15   Historical Provider, MD  calcium acetate (PHOSLO) 667 MG capsule Take 1,334 mg by mouth 3 (three) times daily with meals. Take 2 tablets (1,337mg ) with each meal and between meals for total of 3 times per day. 12/21/15   Historical Provider, MD  febuxostat (ULORIC) 40 MG tablet Take 1 tablet by mouth daily. 07/20/15   Historical Provider, MD  furosemide (LASIX) 40 MG tablet Take 1  tablet by mouth daily. 10/18/15   Historical Provider, MD  guaiFENesin-dextromethorphan (ROBITUSSIN DM) 100-10 MG/5ML syrup Take 5 mLs by mouth every 4 (four) hours as needed for cough. 01/31/16   Fritzi Mandes, MD  levofloxacin (LEVAQUIN) 250 MG tablet Take 1 tablet (250 mg total) by mouth daily. 02/01/16   Fritzi Mandes, MD  losartan (COZAAR) 100 MG tablet Take 100 mg by mouth daily. 01/03/16 01/02/17  Historical Provider, MD  metoprolol tartrate (LOPRESSOR) 25 MG tablet Take 1 tablet (25 mg total) by mouth 2 (two) times daily. 01/31/16   Fritzi Mandes, MD  Oxycodone HCl 10 MG TABS Take 10 mg by mouth every 8 (eight) hours as needed. 06/28/15   Historical Provider, MD  sodium bicarbonate 650 MG tablet Take 650 mg by mouth 2 (two) times daily. 12/06/15   Historical Provider, MD  Vitamin D, Ergocalciferol, (DRISDOL) 50000 units CAPS capsule Take 1 capsule by mouth once a week. wed 12/18/15   Historical Provider, MD    Allergies Patient has no known allergies.  History reviewed. No pertinent family history.  Social History Social History  Substance Use Topics  . Smoking status: Never Smoker  . Smokeless tobacco: Never Used  . Alcohol use No    Review of Systems  Constitutional: No fever/chills. Eyes: No visual changes. ENT: No sore throat. Cardiovascular: Positive for chest pain. Respiratory: Denies shortness of breath. Gastrointestinal: No abdominal pain.  Positive for nausea, no vomiting.  No diarrhea.  No constipation. Genitourinary: Negative for dysuria. Musculoskeletal: Negative for back  pain. Skin: Negative for rash. Neurological: Negative for headaches, focal weakness or numbness.  10-point ROS otherwise negative.  ____________________________________________   PHYSICAL EXAM:  VITAL SIGNS: ED Triage Vitals [02/26/16 0308]  Enc Vitals Group     BP (!) 161/106     Pulse Rate 88     Resp (!) 22     Temp 98 F (36.7 C)     Temp Source Oral     SpO2 97 %     Weight 190 lb  (86.2 kg)     Height 5\' 4"  (1.626 m)     Head Circumference      Peak Flow      Pain Score      Pain Loc      Pain Edu?      Excl. in [employer]?     Constitutional: Alert and oriented. Chronically ill appearing and in moderate acute distress. Eyes: Conjunctivae are normal. PERRL. EOMI. Head: Atraumatic. Nose: No congestion/rhinnorhea. Mouth/Throat: Mucous membranes are moist.  Oropharynx non-erythematous. Neck: No stridor.   Cardiovascular: Normal rate, regular rhythm. Grossly normal heart sounds.  Good peripheral circulation. Respiratory: Normal respiratory effort.  No retractions. Lungs CTAB. Gastrointestinal: Soft and nontender. No distention. No abdominal bruits. No CVA tenderness. Musculoskeletal: No lower extremity tenderness nor edema.  No joint effusions. Neurologic:  Normal speech and language. No gross focal neurologic deficits are appreciated.  Skin:  Skin is warm, dry and intact. No rash noted. Psychiatric: Mood and affect are normal. Speech and behavior are normal.  ____________________________________________   LABS (all labs ordered are listed, but only abnormal results are displayed)  Labs Reviewed  BASIC METABOLIC PANEL - Abnormal; Notable for the following:       Result Value   Potassium 5.5 (*)    Chloride 100 (*)    CO2 13 (*)    Glucose, Bld 102 (*)    BUN 127 (*)    Creatinine, Ser 16.82 (*)    Calcium 6.3 (*)    GFR calc non Af Amer 3 (*)    GFR calc Af Amer 4 (*)    Anion gap 23 (*)    All other components within normal limits  CBC - Abnormal; Notable for the following:    RBC 2.75 (*)    Hemoglobin 8.2 (*)    HCT 24.3 (*)    RDW 16.2 (*)    All other components within normal limits  TROPONIN I - Abnormal; Notable for the following:    Troponin I 0.05 (*)    All other components within normal limits  HEPATIC FUNCTION PANEL  LIPASE, BLOOD   ____________________________________________  EKG  ED ECG REPORT I, Emberlin Verner J, the attending  physician, personally viewed and interpreted this ECG.   Date: 02/26/2016  EKG Time: 0308  Rate: 91  Rhythm: normal EKG, normal sinus rhythm  Axis: Normal  Intervals:none  ST&T Change: T-wave inversion inferior lateral leads New T-wave inversion from 1211/2017 ____________________________________________  RADIOLOGY  Portable chest x-ray (viewed by me, interpreted per Dr. Radene Knee):  Lungs mildly hypoexpanded. Vascular congestion and mild  cardiomegaly. Increased interstitial markings raise concern for  pulmonary edema.   ____________________________________________   PROCEDURES  Procedure(s) performed: None  Procedures  Critical Care performed: Yes, see critical care note(s)   CRITICAL CARE Performed by: Paulette Blanch   Total critical care time: 30 minutes  Critical care time was exclusive of separately billable procedures and treating other patients.  Critical care was necessary to treat or  prevent imminent or life-threatening deterioration.  Critical care was time spent personally by me on the following activities: development of treatment plan with patient and/or surrogate as well as nursing, discussions with consultants, evaluation of patient's response to treatment, examination of patient, obtaining history from patient or surrogate, ordering and performing treatments and interventions, ordering and review of laboratory studies, ordering and review of radiographic studies, pulse oximetry and re-evaluation of patient's condition.  ____________________________________________   INITIAL IMPRESSION / ASSESSMENT AND PLAN / ED COURSE  Pertinent labs & imaging results that were available during my care of the patient were reviewed by me and considered in my medical decision making (see chart for details).  36 year old male with kidney failure on peritoneal dialysis who presents with left-sided chest pressure. EKG demonstrates new T-wave inversion from 01/29/2016 when  patient was hospitalized for pneumonia. Will administer aspirin, nitroglycerin, morphine. Check screening lab work including troponin. Anticipate hospital admission.  Clinical Course      ____________________________________________   FINAL CLINICAL IMPRESSION(S) / ED DIAGNOSES  Final diagnoses:  Chest pain, unspecified type  Unstable angina (HCC)  Stage 5 chronic kidney disease on chronic dialysis [facility])  Essential hypertension      NEW MEDICATIONS STARTED DURING THIS VISIT:  New Prescriptions   No medications on file     Note:  This document was prepared using Dragon voice recognition software and may include unintentional dictation errors.    Paulette Blanch, MD 02/26/16 [phone]

## 2016-02-26 NOTE — ED Triage Notes (Addendum)
Pt presented to ED clutching left chest; says pain woke him from sleep; denies cardiac history; nauseated but no vomiting; pt moaning and restless; c/o shortness of breath; pt was halfway through peritoneal dialysis when pain started; says he is on wait list for kidney transplant

## 2016-02-26 NOTE — Care Management (Signed)
Patient placed in observation for chest pain. Patient with ESRD and receives peritoneal dialysis. Notified Cheryl with [facility] [employer] of admission.

## 2016-02-26 NOTE — Progress Notes (Signed)
[place] at [place] NAME: Louis Lee    MR#:  [id number]  DATE OF BIRTH:  02-09-1981  SUBJECTIVE: Admitted because of left-sided chest pain this since this morning. Patient on peritoneal dialysis 3 TIMES A WEEK, COULD NOT GET DIALYSIS THE ONE TIME LAST WEEK BECAUSE OF  Power outage  At home, STARTED THE PERITONEAL DIALYSIS LAST NIGHT , stopped  PD in the middle, BECAUSE OF CHEST PAIN CAME TO EMERGENCY ROOM. HERE PATIENT TROPONINS NEGATIVE FOR 2 TIMES BUT STILL HAS LEFT-SIDED CHEST PAIN AND HEAVINESS AND IS VERY UNCOMFORTABLE BECAUSE OF CHEST PAIN. PATIENT ALSO HAS SEVERE HYPERKALEMIA WITH POTASSIUM OF 6.2, LOW CALCIUM. PATIENT ON wait LIST AT [facility] FOR TRANSPLANT OF KIDNEY.   CHIEF COMPLAINT:   Chief Complaint  Patient presents with  . Chest Pain    REVIEW OF SYSTEMS:   ROS CONSTITUTIONAL: No fever, fatigue or weakness.  EYES: No blurred or double vision.  EARS, NOSE, AND THROAT: No tinnitus or ear pain.  RESPIRATORY: No cough, shortness of breath, wheezing or hemoptysis.  CARDIOVASCULAR: Left-sided chest pain, orthopnea, edema.  GASTROINTESTINAL: No nausea, vomiting, diarrhea or abdominal pain.  GENITOURINARY: No dysuria, hematuria.  ENDOCRINE: No polyuria, nocturia,  HEMATOLOGY: No anemia, easy bruising or bleeding SKIN: No rash or lesion. MUSCULOSKELETAL: No joint pain or arthritis.   NEUROLOGIC: No tingling, numbness, weakness.  PSYCHIATRY: No anxiety or depression.   DRUG ALLERGIES:  No Known Allergies  VITALS:  Blood pressure 134/82, pulse 87, temperature 98.5 F (36.9 C), temperature source Oral, resp. rate 20, height 5\' 4"  (1.626 m), weight 93.8 kg (206 lb 14.4 oz), SpO2 98 %.  PHYSICAL EXAMINATION:  GENERAL:  36 y.o.-year-old patient lying in the bed with no acute distress. Very uncomfortable due to chest pain. EYES: Pupils equal, round, reactive to light and accommodation. No scleral icterus. Extraocular muscles  intact.  HEENT: Head atraumatic, normocephalic. Oropharynx and nasopharynx clear.  NECK:  Supple, no jugular venous distention. No thyroid enlargement, no tenderness.  LUNGS: Normal breath sounds bilaterally, no wheezing, rales,rhonchi or crepitation. No use of accessory muscles of respiration.  CARDIOVASCULAR: S1, S2 normal. No murmurs, rubs, or gallops.  ABDOMEN: Soft, nontender, nondistended. Bowel sounds present. No organomegaly or mass.  EXTREMITIES: No pedal edema, cyanosis, or clubbing.  NEUROLOGIC: Cranial nerves II through XII are intact. Muscle strength 5/5 in all extremities. Sensation intact. Gait not checked.  PSYCHIATRIC: The patient is alert and oriented x 3.  SKIN: No obvious rash, lesion, or ulcer.    LABORATORY PANEL:   CBC  Recent Labs Lab 02/26/16 0639  WBC 7.5  HGB 7.5*  HCT 22.8*  PLT 275   ------------------------------------------------------------------------------------------------------------------  Chemistries   Recent Labs Lab 02/26/16 0314 02/26/16 0639  NA 136 135  K 5.5* 6.2*  CL 100* 98*  CO2 13* 15*  GLUCOSE 102* 102*  BUN 127* 132*  CREATININE 16.82* 17.33*  CALCIUM 6.3* 6.1*  AST 18  --   ALT 12*  --   ALKPHOS 61  --   BILITOT 1.3*  --    ------------------------------------------------------------------------------------------------------------------  Cardiac Enzymes  Recent Labs Lab 02/26/16 0639  TROPONINI 0.05*   ------------------------------------------------------------------------------------------------------------------  RADIOLOGY:  Dg Chest Port 1 View  Result Date: 02/26/2016 CLINICAL DATA:  Acute onset of left-sided chest pain and nausea. Shortness of breath. Initial encounter. EXAM: PORTABLE CHEST 1 VIEW COMPARISON:  Chest radiograph performed 01/29/2016 FINDINGS: The lungs are mildly hypoexpanded. Vascular congestion is noted. Increased interstitial markings  raise concern for pulmonary edema. No definite  pleural effusion or pneumothorax is seen. The cardiomediastinal silhouette is mildly enlarged. No acute osseous abnormalities are seen. IMPRESSION: Lungs mildly hypoexpanded. Vascular congestion and mild cardiomegaly. Increased interstitial markings raise concern for pulmonary edema. Electronically Signed   By: Garald Balding M.D.   On: 02/26/2016 03:59    EKG:   Orders placed or performed during the hospital encounter of 02/26/16  . EKG 12-Lead  . EKG 12-Lead  . ED EKG within 10 minutes  . ED EKG within 10 minutes   Shows flattened T waves in lateral leads. ASSESSMENT AND PLAN:  #1 left-sided chest pain; sounds atypical, troponins negative for 2 times. On aspirin, metoprolol, nitrates, morphine, cardiology is seeing the patient, patient says that IV Dilaudid is helping better than morphine. Continue oxygen.  troponins are better in fact from December.  #2 ESRD on peritoneal dialysis, missed peritoneal dialysis sessions last week and this week also because of an outpatient, chest pain issues, comes in with severe hyperkalemia: Patient is getting progression shifting measures with Kayexalate, sodium bicarbonate, insulin and dextrose. I discussed with nephrology Dr. Anthonette Legato about his dialysis, hyperkalemia.  3 hypoalbuminemia, hypocalcemia: Record calcium will be around 7.1 but still low; For essential hypertension, obesity, glomerular disease #4 history of hand-foot-and-mouth disease #5 each of gastric bypass, history of anastomotic ulcer: Patient to be continued on PPIs, #6 on wait list for renal transplant: Follows up with nephrologist at [facility], patient is on CellCept, #7 looks like he has chronic hyperkalemia ; on Lasix at home. Hold Cozaar. 8/.h/o gout;continue uloric. D/w   Family. All the records are reviewed and case discussed with Care Management/Social Workerr. Management plans discussed with the patient, family and they are in agreement.  CODE STATUS: full  TOTAL TIME  TAKING CARE OF THIS PATIENT: 66minutes.   POSSIBLE D/C IN 1-2DAYS, DEPENDING ON CLINICAL CONDITION.   Epifanio Lesches M.D on 02/26/2016 at 8:18 AM  Between 7am to 6pm - Pager - [phone]  After 6pm go to [email or web address] - password [email or web address]  [facility]  Office  [phone]  CC: Primary care physician; [facility]   Note: This dictation was prepared with Dragon dictation along with smaller phrase technology. Any transcriptional errors that result from this process are unintentional.

## 2016-02-26 NOTE — ED Notes (Signed)
Patient transport to 234

## 2016-02-26 NOTE — Progress Notes (Signed)
Connected to peritoneal dialysis cycler without complications.Patient reminded that he was connected and to use caution when getting out of bed,verbalized understanding.

## 2016-02-27 DIAGNOSIS — N186 End stage renal disease: Secondary | ICD-10-CM

## 2016-02-27 DIAGNOSIS — I309 Acute pericarditis, unspecified: Secondary | ICD-10-CM | POA: Diagnosis present

## 2016-02-27 DIAGNOSIS — I10 Essential (primary) hypertension: Secondary | ICD-10-CM | POA: Diagnosis present

## 2016-02-27 DIAGNOSIS — D649 Anemia, unspecified: Secondary | ICD-10-CM | POA: Diagnosis present

## 2016-02-27 DIAGNOSIS — I429 Cardiomyopathy, unspecified: Secondary | ICD-10-CM

## 2016-02-27 DIAGNOSIS — R0789 Other chest pain: Secondary | ICD-10-CM

## 2016-02-27 DIAGNOSIS — N2889 Other specified disorders of kidney and ureter: Secondary | ICD-10-CM

## 2016-02-27 DIAGNOSIS — I151 Hypertension secondary to other renal disorders: Secondary | ICD-10-CM

## 2016-02-27 LAB — GLUCOSE, CAPILLARY
GLUCOSE-CAPILLARY: 106 mg/dL — AB (ref 65–99)
GLUCOSE-CAPILLARY: 189 mg/dL — AB (ref 65–99)
Glucose-Capillary: 138 mg/dL — ABNORMAL HIGH (ref 65–99)
Glucose-Capillary: 168 mg/dL — ABNORMAL HIGH (ref 65–99)

## 2016-02-27 LAB — BASIC METABOLIC PANEL
ANION GAP: 22 — AB (ref 5–15)
BUN: 130 mg/dL — ABNORMAL HIGH (ref 6–20)
CALCIUM: 6.5 mg/dL — AB (ref 8.9–10.3)
CO2: 15 mmol/L — AB (ref 22–32)
CREATININE: 16.53 mg/dL — AB (ref 0.61–1.24)
Chloride: 97 mmol/L — ABNORMAL LOW (ref 101–111)
GFR calc Af Amer: 4 mL/min — ABNORMAL LOW (ref >60)
GFR, EST NON AFRICAN AMERICAN: 3 mL/min — AB (ref >60)
GLUCOSE: 126 mg/dL — AB (ref 65–99)
Potassium: 6.3 mmol/L (ref 3.5–5.1)
Sodium: 134 mmol/L — ABNORMAL LOW (ref 135–145)

## 2016-02-27 LAB — TROPONIN I: TROPONIN I: 0.06 ng/mL — AB (ref <0.03)

## 2016-02-27 MED ORDER — SODIUM CHLORIDE 0.9 % IV SOLN
1.0000 g | Freq: Once | INTRAVENOUS | Status: AC
  Administered 2016-02-27: 1 g via INTRAVENOUS
  Filled 2016-02-27: qty 10

## 2016-02-27 MED ORDER — SODIUM BICARBONATE 8.4 % IV SOLN
50.0000 meq | Freq: Once | INTRAVENOUS | Status: AC
  Administered 2016-02-27: 50 meq via INTRAVENOUS

## 2016-02-27 MED ORDER — INSULIN ASPART 100 UNIT/ML IV SOLN
5.0000 [IU] | Freq: Once | INTRAVENOUS | Status: AC
  Administered 2016-02-27: 5 [IU] via INTRAVENOUS
  Filled 2016-02-27: qty 0.05

## 2016-02-27 MED ORDER — SODIUM POLYSTYRENE SULFONATE 15 GM/60ML PO SUSP
30.0000 g | Freq: Once | ORAL | Status: AC
  Administered 2016-02-27: 30 g via ORAL
  Filled 2016-02-27: qty 120

## 2016-02-27 MED ORDER — DEXTROSE 50 % IV SOLN
1.0000 | Freq: Once | INTRAVENOUS | Status: AC
  Administered 2016-02-27: 50 mL via INTRAVENOUS
  Filled 2016-02-27: qty 50

## 2016-02-27 MED ORDER — EPOETIN ALFA 10000 UNIT/ML IJ SOLN
20000.0000 [IU] | Freq: Once | INTRAMUSCULAR | Status: AC
  Administered 2016-02-27: 20000 [IU] via SUBCUTANEOUS
  Filled 2016-02-27: qty 2

## 2016-02-27 MED ORDER — INSULIN ASPART 100 UNIT/ML ~~LOC~~ SOLN: 5.0000 [IU] | Freq: Once | SUBCUTANEOUS | Status: DC

## 2016-02-27 MED ORDER — HYDROCODONE-ACETAMINOPHEN 5-325 MG PO TABS
1.0000 | ORAL_TABLET | ORAL | Status: DC | PRN
  Administered 2016-02-27 – 2016-02-28 (×4): 1 via ORAL
  Filled 2016-02-27 (×4): qty 1

## 2016-02-27 NOTE — Progress Notes (Signed)
Post CCPD assessment. Patient has no complaints. Report called to [facility].

## 2016-02-27 NOTE — Progress Notes (Signed)
CRITICAL VALUE ALERT  Critical value received: potassium 6.3 Date of notification:  02/27/16  Time of notification: 8875   Critical value read back: yes Nurse who received alert:  Lennart Pall MD notified (1st page): Dr. Vianne Bulls Time of first page:  1453 MD notified (2nd page):  Time of second page:  Responding MD:  Vianne Bulls Time MD responded:  Orders written at 14:56

## 2016-02-27 NOTE — Care Management (Signed)
Now inpatient.  Tolerating peritoneal dialysis.  Potassium stabilizing.  Calcium remains low but informed this is chronic for patient. Being treated for pericarditis and sx improving

## 2016-02-27 NOTE — Progress Notes (Signed)
[facility]  ROUNDING NOTE   Subjective:  Patient did well with peritoneal dialysis overnight. He reports an area of proud flesh adjacent to his exit site. This does have some mild drainage.    Objective:  Vital signs in last 24 hours:  Temp:  [97.7 F (36.5 C)-98.4 F (36.9 C)] 98.2 F (36.8 C) (01/09 1204) Pulse Rate:  [70-87] 81 (01/09 1204) Resp:  [11-20] 11 (01/09 1204) BP: (125-149)/(71-93) 125/71 (01/09 1204) SpO2:  [96 %-100 %] 97 % (01/09 1204) Weight:  [93.7 kg (206 lb 8 oz)] 93.7 kg (206 lb 8 oz) (01/09 0413)  Weight change: 7.484 kg (16 lb 8 oz) Filed Weights   02/26/16 0308 02/26/16 0610 02/27/16 0413  Weight: 86.2 kg (190 lb) 93.8 kg (206 lb 14.4 oz) 93.7 kg (206 lb 8 oz)    Intake/Output: I/O last 3 completed shifts: In: 720 [P.O.:720] Out: 100 [Urine:100]   Intake/Output this shift:  Total I/O In: 240 [P.O.:240] Out: 537 [Other:537]  Physical Exam: General: No acute distress  Head: Normocephalic, atraumatic. Moist oral mucosal membranes  Eyes: Anicteric  Neck: Supple, trachea midline  Lungs:  Basilar rales, normal effort  Heart: S1S2 faint pericardial friction rub  Abdomen:  Soft, nontender, bowel sounds present  Extremities: 1+ peripheral edema.  Neurologic: Nonfocal, moving all four extremities  Skin: No lesions  Access: PD catheter in place, area of proud flesh with drainage noted adjacent to exit site.    Basic Metabolic Panel:  Recent Labs Lab 02/26/16 0314 02/26/16 0639 02/26/16 1001  NA 136 135  --   K 5.5* 6.2* 5.5*  CL 100* 98*  --   CO2 13* 15*  --   GLUCOSE 102* 102*  --   BUN 127* 132*  --   CREATININE 16.82* 17.33*  --   CALCIUM 6.3* 6.1*  --     Liver Function Tests:  Recent Labs Lab 02/26/16 0314  AST 18  ALT 12*  ALKPHOS 61  BILITOT 1.3*  PROT 7.5  ALBUMIN 2.6*    Recent Labs Lab 02/26/16 0314  LIPASE 54*   No results for input(s): AMMONIA in the last 168 hours.  CBC:  Recent  Labs Lab 02/26/16 0314 02/26/16 0639  WBC 7.2 7.5  HGB 8.2* 7.5*  HCT 24.3* 22.8*  MCV 88.4 88.7  PLT 317 275    Cardiac Enzymes:  Recent Labs Lab 02/26/16 0314 02/26/16 0639 02/26/16 1001 02/26/16 1555  TROPONINI 0.05* 0.05* 0.06* 0.06*    BNP: Invalid input(s): POCBNP  CBG:  Recent Labs Lab 02/26/16 1212 02/26/16 1636 02/27/16 0749 02/27/16 1131  GLUCAP 113* 138* 106* 189*    Microbiology: Results for orders placed or performed during the hospital encounter of 01/29/16  Blood culture (routine x 2)     Status: None   Collection Time: 01/29/16  8:18 PM  Result Value Ref Range Status   Specimen Description BLOOD RIGHT [facility]  Final   Special Requests   Final    BOTTLES DRAWN AEROBIC AND ANAEROBIC ANA12CC [id number]   Culture NO GROWTH 5 DAYS  Final   Report Status 02/03/2016 FINAL  Final  Blood culture (routine x 2)     Status: None   Collection Time: 01/29/16  8:18 PM  Result Value Ref Range Status   Specimen Description BLOOD LEFT AC  Final   Special Requests BOTTLES DRAWN AEROBIC AND ANAEROBIC [facility] [id number]  Final   Culture NO GROWTH 5 DAYS  Final   Report Status 02/03/2016  FINAL  Final  Body fluid culture     Status: None   Collection Time: 01/31/16  8:38 AM  Result Value Ref Range Status   Specimen Description PERITONEAL  Final   Special Requests Normal  Final   Gram Stain NO WBC SEEN NO ORGANISMS SEEN   Final   Culture   Final    NO GROWTH 3 DAYS Performed at [facility]    Report Status 02/03/2016 FINAL  Final  Aerobic Culture (superficial specimen)     Status: None   Collection Time: 01/31/16  8:50 AM  Result Value Ref Range Status   Specimen Description ABDOMEN  Final   Special Requests Normal  Final   Gram Stain   Final    MODERATE WBC PRESENT, PREDOMINANTLY PMN FEW GRAM POSITIVE COCCI Performed at [place]  Final   Report Status 02/02/2016 FINAL  Final   Organism ID,  Bacteria STAPHYLOCOCCUS AUREUS  Final      Susceptibility   Staphylococcus aureus - MIC*    CIPROFLOXACIN >=8 RESISTANT Resistant     ERYTHROMYCIN <=0.25 SENSITIVE Sensitive     GENTAMICIN <=0.5 SENSITIVE Sensitive     OXACILLIN 0.5 SENSITIVE Sensitive     TETRACYCLINE <=1 SENSITIVE Sensitive     VANCOMYCIN <=0.5 SENSITIVE Sensitive     TRIMETH/SULFA <=10 SENSITIVE Sensitive     CLINDAMYCIN <=0.25 SENSITIVE Sensitive     RIFAMPIN <=0.5 SENSITIVE Sensitive     Inducible Clindamycin NEGATIVE Sensitive     * MODERATE STAPHYLOCOCCUS AUREUS    Coagulation Studies: No results for input(s): LABPROT, INR in the last 72 hours.  Urinalysis: No results for input(s): COLORURINE, LABSPEC, PHURINE, GLUCOSEU, HGBUR, BILIRUBINUR, KETONESUR, PROTEINUR, UROBILINOGEN, NITRITE, LEUKOCYTESUR in the last 72 hours.  Invalid input(s): APPERANCEUR    Imaging: Dg Chest Port 1 View  Result Date: 02/26/2016 CLINICAL DATA:  Acute onset of left-sided chest pain and nausea. Shortness of breath. Initial encounter. EXAM: PORTABLE CHEST 1 VIEW COMPARISON:  Chest radiograph performed 01/29/2016 FINDINGS: The lungs are mildly hypoexpanded. Vascular congestion is noted. Increased interstitial markings raise concern for pulmonary edema. No definite pleural effusion or pneumothorax is seen. The cardiomediastinal silhouette is mildly enlarged. No acute osseous abnormalities are seen. IMPRESSION: Lungs mildly hypoexpanded. Vascular congestion and mild cardiomegaly. Increased interstitial markings raise concern for pulmonary edema. Electronically Signed   By: Garald Balding M.D.   On: 02/26/2016 03:59     Medications:    . amLODipine  5 mg Oral Daily  . aspirin EC  81 mg Oral Daily  . calcium acetate  1,334 mg Oral TID WC  . dialysis solution 2.5% low-MG/low-CA   Intraperitoneal Q24H  . febuxostat  40 mg Oral Daily  . furosemide  40 mg Oral Daily  . gentamicin cream  1 application Topical Daily  . heparin  5,000  Units Subcutaneous Q8H  . losartan  100 mg Oral Daily  . metoprolol tartrate  25 mg Oral BID  . pantoprazole  40 mg Oral Daily  . predniSONE  50 mg Oral Q breakfast  . sodium bicarbonate  650 mg Oral BID  . sodium chloride flush  3 mL Intravenous Q12H  . [START ON 02/28/2016] Vitamin D (Ergocalciferol)  50,000 Units Oral Weekly   sodium chloride, acetaminophen, albuterol, HYDROcodone-acetaminophen, nitroGLYCERIN, ondansetron (ZOFRAN) IV, sodium chloride flush  Assessment/ Plan:  36 y.o. 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   [facility]/PD  1. End-stage renal disease on PD. 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  BUN/Cr quite high. -We will maintain the patient on current peritoneal dialysis regimen.  He will need to perform this daily as opposed to 3 times a week as he was previously doing.  2. Anemia of chronic kidney disease Hemoglobin 7.5.  Administer Epogen 20,000 units subcutaneous today.  3. Hyperkalemia  Potassium was down to 5.5 yesterday.  Continue to periodically monitor serum potassium.  4. Shortness of breath/chest pain:  Suspect possible uremic pericarditis, will need to intensify PD as above.     LOS: 1 Louis Lee 1/9/20182:19 PM

## 2016-02-27 NOTE — Progress Notes (Signed)
[place] at [place] NAME: Louis Lee    MR#:  [id number]  DATE OF BIRTH:  07/03/1980    CHIEF COMPLAINT:   Chief Complaint  Patient presents with  . Chest Pain    REVIEW OF SYSTEMS:   ROS CONSTITUTIONAL: No fever, fatigue or weakness.  EYES: No blurred or double vision.  EARS, NOSE, AND THROAT: No tinnitus or ear pain.  RESPIRATORY: No cough, shortness of breath, wheezing or hemoptysis.  CARDIOVASCULAR: Left-sided chest pain, orthopnea, edema.  GASTROINTESTINAL: No nausea, vomiting, diarrhea or abdominal pain.  GENITOURINARY: No dysuria, hematuria.  ENDOCRINE: No polyuria, nocturia,  HEMATOLOGY: No anemia, easy bruising or bleeding SKIN: No rash or lesion. MUSCULOSKELETAL: No joint pain or arthritis.   NEUROLOGIC: No tingling, numbness, weakness.  PSYCHIATRY: No anxiety or depression.   DRUG ALLERGIES:  No Known Allergies  VITALS:  Blood pressure 125/71, pulse 81, temperature 98.2 F (36.8 C), temperature source Oral, resp. rate 11, height 5\' 4"  (1.626 m), weight 93.7 kg (206 lb 8 oz), SpO2 97 %.  PHYSICAL EXAMINATION:  GENERAL:  36 y.o.-year-old patient lying in the bed with no acute distress. Very uncomfortable due to chest pain. EYES: Pupils equal, round, reactive to light and accommodation. No scleral icterus. Extraocular muscles intact.  HEENT: Head atraumatic, normocephalic. Oropharynx and nasopharynx clear.  NECK:  Supple, no jugular venous distention. No thyroid enlargement, no tenderness.  LUNGS: Normal breath sounds bilaterally, no wheezing, rales,rhonchi or crepitation. No use of accessory muscles of respiration.  CARDIOVASCULAR: S1, S2 normal. No murmurs, rubs, or gallops.  ABDOMEN: Soft, nontender, nondistended. Bowel sounds present. No organomegaly or mass.  EXTREMITIES: No pedal edema, cyanosis, or clubbing.  NEUROLOGIC: Cranial nerves II through XII are intact. Muscle strength 5/5 in all extremities.  Sensation intact. Gait not checked.  PSYCHIATRIC: The patient is alert and oriented x 3.  SKIN: No obvious rash, lesion, or ulcer.    LABORATORY PANEL:   CBC  Recent Labs Lab 02/26/16 0639  WBC 7.5  HGB 7.5*  HCT 22.8*  PLT 275   ------------------------------------------------------------------------------------------------------------------  Chemistries   Recent Labs Lab 02/26/16 0314 02/26/16 0639 02/26/16 1001  NA 136 135  --   K 5.5* 6.2* 5.5*  CL 100* 98*  --   CO2 13* 15*  --   GLUCOSE 102* 102*  --   BUN 127* 132*  --   CREATININE 16.82* 17.33*  --   CALCIUM 6.3* 6.1*  --   AST 18  --   --   ALT 12*  --   --   ALKPHOS 61  --   --   BILITOT 1.3*  --   --    ------------------------------------------------------------------------------------------------------------------  Cardiac Enzymes  Recent Labs Lab 02/26/16 1555  TROPONINI 0.06*   ------------------------------------------------------------------------------------------------------------------  RADIOLOGY:  Dg Chest Port 1 View  Result Date: 02/26/2016 CLINICAL DATA:  Acute onset of left-sided chest pain and nausea. Shortness of breath. Initial encounter. EXAM: PORTABLE CHEST 1 VIEW COMPARISON:  Chest radiograph performed 01/29/2016 FINDINGS: The lungs are mildly hypoexpanded. Vascular congestion is noted. Increased interstitial markings raise concern for pulmonary edema. No definite pleural effusion or pneumothorax is seen. The cardiomediastinal silhouette is mildly enlarged. No acute osseous abnormalities are seen. IMPRESSION: Lungs mildly hypoexpanded. Vascular congestion and mild cardiomegaly. Increased interstitial markings raise concern for pulmonary edema. Electronically Signed   By: Garald Balding M.D.   On: 02/26/2016 03:59    EKG:   Orders placed or  performed during the hospital encounter of 02/26/16  . EKG 12-Lead  . EKG 12-Lead  . ED EKG within 10 minutes  . ED EKG within 10 minutes    Shows flattened T waves in lateral leads. ASSESSMENT AND PLAN:  #1 left-sided chest pain; posiible acute pericarditis: Troponins are negative. Seen by cardiology, continue by mouth prednisone, discontinue IV dilaudid. Continue by mouth Percocet. . 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 Hold Cozaar. 8/.h/o gout;continue uloric. D/w   Family. All the records are reviewed and case discussed with Care Management/Social Workerr. Management plans discussed with the patient, family and they are in agreement.  CODE STATUS: full  TOTAL TIME TAKING CARE OF THIS PATIENT: 3minutes.   POSSIBLE D/C IN 1-2DAYS, DEPENDING ON CLINICAL CONDITION.   Epifanio Lesches M.D on 02/27/2016 at 1:36 PM  Between 7am to 6pm - Pager - [phone]  After 6pm go to [email or web address] - password [email or web address]  [facility]  Office  [phone]  CC: Primary care physician; [facility]   Note: This dictation was prepared with Dragon dictation along with smaller phrase technology. Any transcriptional errors that result from this process are unintentional.

## 2016-02-27 NOTE — Progress Notes (Signed)
Pre ccpd assessment

## 2016-02-27 NOTE — Progress Notes (Signed)
Patient Name: Louis Lee Date of Encounter: 02/27/2016  Primary Cardiologist: New - M. Fletcher Anon, MD  Hospital Problem List     Principal Problem:   Chest pain Active Problems:   Hyperkalemia     Subjective   Chest pain improved but not resolved.  Has left-sided chest pressure with deep inspiration; not positional.  No shortness of breath currently.  Tolerated PD well overnight.  Inpatient Medications    Scheduled Meds: . amLODipine  5 mg Oral Daily  . aspirin EC  81 mg Oral Daily  . calcium acetate  1,334 mg Oral TID WC  . dialysis solution 2.5% low-MG/low-CA   Intraperitoneal Q24H  . febuxostat  40 mg Oral Daily  . furosemide  40 mg Oral Daily  . gentamicin cream  1 application Topical Daily  . heparin  5,000 Units Subcutaneous Q8H  . losartan  100 mg Oral Daily  . metoprolol tartrate  25 mg Oral BID  . pantoprazole  40 mg Oral Daily  . predniSONE  50 mg Oral Q breakfast  . sodium bicarbonate  650 mg Oral BID  . sodium chloride flush  3 mL Intravenous Q12H  . [START ON 02/28/2016] Vitamin D (Ergocalciferol)  50,000 Units Oral Weekly   Continuous Infusions:  PRN Meds: sodium chloride, acetaminophen, albuterol, HYDROmorphone (DILAUDID) injection, nitroGLYCERIN, ondansetron (ZOFRAN) IV, sodium chloride flush   Vital Signs    Vitals:   02/26/16 0813 02/26/16 1143 02/26/16 1934 02/27/16 0413  BP: 134/82 131/74 131/81 (!) 149/93  Pulse: 87 86 74 70  Resp: 20 20 16 18   Temp: 98.5 F (36.9 C) 98 F (36.7 C) 98.4 F (36.9 C) 98.4 F (36.9 C)  TempSrc: Oral Oral Oral Oral  SpO2: 98% 91% 99% 100%  Weight:    206 lb 8 oz (93.7 kg)  Height:        Intake/Output Summary (Last 24 hours) at 02/27/16 0743 Last data filed at 02/26/16 1954  Gross per 24 hour  Intake              720 ml  Output              100 ml  Net              620 ml   Filed Weights   02/26/16 0308 02/26/16 0610 02/27/16 0413  Weight: 190 lb (86.2 kg) 206 lb 14.4 oz (93.8 kg) 206 lb 8 oz (93.7  kg)    Physical Exam   GEN: Overweight man, lying comfortably in bed. [employer] noted at the bedside. HEENT: Grossly normal.  Neck: Supple.  JVP ~8 cm with +HJR. Cardiac: RRR with prominent friction rub.  No murmurs. No clubbing, cyanosis, edema.  Radials/DP/PT 2+ and equal bilaterally.  Respiratory:  Respirations regular and unlabored, clear to auscultation bilaterally. GI: Soft, nontender, nondistended, BS + x 4.  Left-sided PD catheter in place and covered with clean dressing. MS: no deformity or atrophy. Skin: warm and dry, no rash.  Bilateral nipple rings present. Neuro:  Strength and sensation are intact. Psych: AAOx3.  Normal affect.  Labs    CBC  Recent Labs  02/26/16 0314 02/26/16 0639  WBC 7.2 7.5  HGB 8.2* 7.5*  HCT 24.3* 22.8*  MCV 88.4 88.7  PLT 317 631   Basic Metabolic Panel  Recent Labs  02/26/16 0314 02/26/16 0639 02/26/16 1001  NA 136 135  --   K 5.5* 6.2* 5.5*  CL 100* 98*  --  CO2 13* 15*  --   GLUCOSE 102* 102*  --   BUN 127* 132*  --   CREATININE 16.82* 17.33*  --   CALCIUM 6.3* 6.1*  --    Liver Function Tests  Recent Labs  02/26/16 0314  AST 18  ALT 12*  ALKPHOS 61  BILITOT 1.3*  PROT 7.5  ALBUMIN 2.6*    Recent Labs  02/26/16 0314  LIPASE 54*   Cardiac Enzymes  Recent Labs  02/26/16 0639 02/26/16 1001 02/26/16 1555  TROPONINI 0.05* 0.06* 0.06*   BNP Invalid input(s): POCBNP D-Dimer No results for input(s): DDIMER in the last 72 hours. Hemoglobin A1C No results for input(s): HGBA1C in the last 72 hours. Fasting Lipid Panel  Recent Labs  02/26/16 0639  CHOL 132  HDL 35*  LDLCALC 82  TRIG 74  CHOLHDL 3.8   Thyroid Function Tests No results for input(s): TSH, T4TOTAL, T3FREE, THYROIDAB in the last 72 hours.  Invalid input(s): FREET3  Telemetry    NSR with rare PVCs - Personally Reviewed  ECG    02/26/16: NSR with non-specific T-wave abnormality - Personally Reviewed  Radiology    Dg Chest  Port 1 View  Result Date: 02/26/2016 CLINICAL DATA:  Acute onset of left-sided chest pain and nausea. Shortness of breath. Initial encounter. EXAM: PORTABLE CHEST 1 VIEW COMPARISON:  Chest radiograph performed 01/29/2016 FINDINGS: The lungs are mildly hypoexpanded. Vascular congestion is noted. Increased interstitial markings raise concern for pulmonary edema. No definite pleural effusion or pneumothorax is seen. The cardiomediastinal silhouette is mildly enlarged. No acute osseous abnormalities are seen. IMPRESSION: Lungs mildly hypoexpanded. Vascular congestion and mild cardiomegaly. Increased interstitial markings raise concern for pulmonary edema. Electronically Signed   By: Garald Balding M.D.   On: 02/26/2016 03:59    Cardiac Studies   TTE: Mildly dilated LV with mild LVH.  LVEF 45-50% with grade 2 diastolic dysfunction.  Mild MR with moderate left atrial enlargement.  Normal RV size and function.  Elevated CVP.  No pericardial effusion.  Patient Profile     36 y/o man with history of ESRD on PD (followed by [facility]) and hypertension, admitted with acute onset of chest pain on 02/25/16 consistent with pericarditis.  Assessment & Plan    Acute pericarditis Pain (worse with deep inspiration) and friction rub are consistent with pericarditis.  Patient endorses recent respiratory infection, which could have been nidus for pericarditis.  ESRD with markedly elevated BUN/creatinine on admission suggest potential uremic pericarditis as well.  Symptoms have improved with steroids.  Continue prednisone.  Patient not a candidate for NSAIDs given ESRD.  Would also favor avoiding colchicine if possible, given advanced renal disease.  Cardiomyopathy LVEF noted to be mildly reduced with evidence of LV dilation.  Stress test at [facility] in 04/2015 was without evidence of significant ischemia or scar, suggesting NICM.  Minimal troponin elevation without rise and fall is non-specific in setting of ESRD and not  consistent with acute coronary event.  Patient has evidence of volume overload by exam and echo.  Continue metoprolol and losartan.  Appreciate nephrology's assistance; patient would likely benefit from from aggressive dialysis regimen, including fluid removal.  Defer further ischemia evaluation at this time, given probably normal stress test <1 year ago.  Normocytic anemia Patient with significant anemia on admission, which is likely contributing to his shortness of breath.  Suspect this is due to chronic disease (including ESRD).  Continue to monitor.  Further management per hospitalist service and nephrology.  Hypertension Blood pressure upper normal to mildly elevated.  Continue current regimen of amlodipine, metoprolol, and losartan.  Signed, Nelva Bush, MD  02/27/2016, 7:43 AM

## 2016-02-27 NOTE — Progress Notes (Addendum)
CCPD initiated without issue. Dressing changed and gent cream applied. Patient currently has no complaints. Resting comfortably in bed. Report given to [facility] RN. Notified to call 1800 number on front of machine if any trouble overnight. Will be back to disconnect first thing in am.

## 2016-02-27 NOTE — Progress Notes (Signed)
Patient had 1 episode of nausea and vomiting after receiving pain medication. Patient has slept most of the shift while dialysis was cycling. No complications noted thus far.

## 2016-02-27 NOTE — Progress Notes (Signed)
CCPD completed without issue. Total UF 519ml. Patient tolerated well. No complaints.

## 2016-02-27 NOTE — Progress Notes (Deleted)
Patient returned from special procedures where his perm cath was removed.  The site dressing is clean, dry, and secure.

## 2016-02-28 DIAGNOSIS — I10 Essential (primary) hypertension: Secondary | ICD-10-CM

## 2016-02-28 DIAGNOSIS — N186 End stage renal disease: Secondary | ICD-10-CM

## 2016-02-28 DIAGNOSIS — I429 Cardiomyopathy, unspecified: Secondary | ICD-10-CM

## 2016-02-28 DIAGNOSIS — I480 Paroxysmal atrial fibrillation: Secondary | ICD-10-CM

## 2016-02-28 LAB — BASIC METABOLIC PANEL
Anion gap: 22 — ABNORMAL HIGH (ref 5–15)
BUN: 128 mg/dL — AB (ref 6–20)
CHLORIDE: 99 mmol/L — AB (ref 101–111)
CO2: 17 mmol/L — ABNORMAL LOW (ref 22–32)
CREATININE: 14.97 mg/dL — AB (ref 0.61–1.24)
Calcium: 6.6 mg/dL — ABNORMAL LOW (ref 8.9–10.3)
GFR calc non Af Amer: 4 mL/min — ABNORMAL LOW (ref >60)
GFR, EST AFRICAN AMERICAN: 4 mL/min — AB (ref >60)
Glucose, Bld: 111 mg/dL — ABNORMAL HIGH (ref 65–99)
POTASSIUM: 4.9 mmol/L (ref 3.5–5.1)
SODIUM: 138 mmol/L (ref 135–145)

## 2016-02-28 MED ORDER — DELFLEX-LC/4.25% DEXTROSE 483 MOSM/L IP SOLN
INTRAPERITONEAL | Status: DC
  Administered 2016-02-29: 19:00:00 via INTRAPERITONEAL
  Filled 2016-02-28: qty 3000

## 2016-02-28 MED ORDER — HYDROMORPHONE HCL 1 MG/ML IJ SOLN
2.0000 mg | INTRAMUSCULAR | Status: DC | PRN
  Administered 2016-02-28 – 2016-02-29 (×6): 2 mg via INTRAVENOUS
  Filled 2016-02-28 (×6): qty 2

## 2016-02-28 NOTE — Progress Notes (Signed)
PD disconnect.  Pt tolerated tx.

## 2016-02-28 NOTE — Progress Notes (Signed)
Patient remained hemodynamically stable overnight. Chest pain is currently being controlled with PRN pain med. VS WDL for patient, NSR .Patient was CCPD without any complication.

## 2016-02-28 NOTE — Care Management (Signed)
Discussed mobilization of patient to prevent decline in functional status.  Patient is followed by [facility] in [place] for his peritoneal dialysis.  Says he manages the treatments himself.  He would benefit form home health nurse follow up due to complicated medical issues.

## 2016-02-28 NOTE — Progress Notes (Signed)
PD start 

## 2016-02-28 NOTE — Progress Notes (Signed)
[place] at [place] NAME: Louis Lee    MR#:  [id number]  DATE OF BIRTH:  [birth year]  Patient is seen side, complaints of chest pain not better.. Requesting IV pain medicine.   CHIEF COMPLAINT:   Chief Complaint  Patient presents with  . Chest Pain    REVIEW OF SYSTEMS:   ROS CONSTITUTIONAL: No fever, fatigue or weakness.  EYES: No blurred or double vision.  EARS, NOSE, AND THROAT: No tinnitus or ear pain.  RESPIRATORY: No cough, shortness of breath, wheezing or hemoptysis.  CARDIOVASCULAR: Left-sided chest pain, orthopnea, edema.  GASTROINTESTINAL: No nausea, vomiting, diarrhea or abdominal pain.  GENITOURINARY: No dysuria, hematuria.  ENDOCRINE: No polyuria, nocturia,  HEMATOLOGY: No anemia, easy bruising or bleeding SKIN: No rash or lesion. MUSCULOSKELETAL: No joint pain or arthritis.   NEUROLOGIC: No tingling, numbness, weakness.  PSYCHIATRY: No anxiety or depression.   DRUG ALLERGIES:  No Known Allergies  VITALS:  Blood pressure (!) 152/83, pulse 82, temperature 98.2 F (36.8 C), temperature source Oral, resp. rate 18, height 5\' 4"  (1.626 m), weight 95.3 kg (210 lb 3.2 oz), SpO2 97 %.  PHYSICAL EXAMINATION:  GENERAL:  36 y.o.-year-old patient lying in the bed with no acute distress. Very uncomfortable due to chest pain. EYES: Pupils equal, round, reactive to light and accommodation. No scleral icterus. Extraocular muscles intact.  HEENT: Head atraumatic, normocephalic. Oropharynx and nasopharynx clear.  NECK:  Supple, no jugular venous distention. No thyroid enlargement, no tenderness.  LUNGS: Normal breath sounds bilaterally, no wheezing, rales,rhonchi or crepitation. No use of accessory muscles of respiration.  CARDIOVASCULAR: S1, S2 normal. No murmurs, rubs, or gallops.  ABDOMEN: Soft, nontender, nondistended. Bowel sounds present. No organomegaly or mass.  EXTREMITIES: No pedal edema, cyanosis, or clubbing.   NEUROLOGIC: Cranial nerves II through XII are intact. Muscle strength 5/5 in all extremities. Sensation intact. Gait not checked.  PSYCHIATRIC: The patient is alert and oriented x 3.  SKIN: No obvious rash, lesion, or ulcer.    LABORATORY PANEL:   CBC  Recent Labs Lab 02/26/16 0639  WBC 7.5  HGB 7.5*  HCT 22.8*  PLT 275   ------------------------------------------------------------------------------------------------------------------  Chemistries   Recent Labs Lab 02/26/16 0314  02/28/16 0730  NA 136  < > 138  K 5.5*  < > 4.9  CL 100*  < > 99*  CO2 13*  < > 17*  GLUCOSE 102*  < > 111*  BUN 127*  < > 128*  CREATININE 16.82*  < > 14.97*  CALCIUM 6.3*  < > 6.6*  AST 18  --   --   ALT 12*  --   --   ALKPHOS 61  --   --   BILITOT 1.3*  --   --   < > = values in this interval not displayed. ------------------------------------------------------------------------------------------------------------------  Cardiac Enzymes  Recent Labs Lab 02/26/16 1555  TROPONINI 0.06*   ------------------------------------------------------------------------------------------------------------------  RADIOLOGY:  No results found.  EKG:   Orders placed or performed during the hospital encounter of 02/26/16  . EKG 12-Lead  . EKG 12-Lead  . ED EKG within 10 minutes  . ED EKG within 10 minutes   Shows flattened T waves in lateral leads. ASSESSMENT AND PLAN:  #1 left-sided chest pain; posiible acute pericarditis: Troponins are negative. Seen by cardiology, continue by mouth prednisone, And says that pain not well controlled with by mouth Percocet.START IV DAILUDID FOR next 24hrs. and possible discharge tomorrow.  #  2 ESRD on peritoneal dialysis,, missed peritoneal dialysis sessions last week and this week also because of an outpatient, chest pain issues, comes in with severe hyperkalemia:  Likely  uremic  Pericarditis/ followed up with nephrology,; patient needs intensification  of peritoneal dialysis prescription as ordered by nephrology.  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 ; on Lasix at home. Hold Cozaar. 8/.h/o gout;continue uloric. D/w   Family. Discharge home tomorrow with prednisone, by mouth Percocet.  All the records are reviewed and case discussed with Care Management/Social Workerr. Management plans discussed with the patient, family and they are in agreement.  CODE STATUS: full  TOTAL TIME TAKING CARE OF THIS PATIENT: 36minutes.   POSSIBLE D/C IN 1-2DAYS, DEPENDING ON CLINICAL CONDITION.   Epifanio Lesches M.D on 02/28/2016 at 1:05 PM  Between 7am to 6pm - Pager - [phone]  After 6pm go to [email or web address] - password [email or web address]  [facility]  Office  [phone]  CC: Primary care physician; [facility]   Note: This dictation was prepared with Dragon dictation along with smaller phrase technology. Any transcriptional errors that result from this process are unintentional.

## 2016-02-28 NOTE — Progress Notes (Signed)
Patient Name: Louis Lee Date of Encounter: 02/28/2016  Primary Cardiologist: [facility] Problem List     Principal Problem:   Acute pericarditis Active Problems:   Chest pain   Hyperkalemia   Hypertension   Cardiomyopathy ([place])   Normocytic anemia     Subjective   Still with chest pain, worse with deep inspiration. Asking for pain medication. No positional. No SOB. Tolerating PD without issues.   Inpatient Medications    Scheduled Meds: . amLODipine  5 mg Oral Daily  . aspirin EC  81 mg Oral Daily  . calcium acetate  1,334 mg Oral TID WC  . dialysis solution 2.5% low-MG/low-CA   Intraperitoneal Q24H  . febuxostat  40 mg Oral Daily  . furosemide  40 mg Oral Daily  . gentamicin cream  1 application Topical Daily  . heparin  5,000 Units Subcutaneous Q8H  . losartan  100 mg Oral Daily  . metoprolol tartrate  25 mg Oral BID  . pantoprazole  40 mg Oral Daily  . predniSONE  50 mg Oral Q breakfast  . sodium bicarbonate  650 mg Oral BID  . sodium chloride flush  3 mL Intravenous Q12H  . Vitamin D (Ergocalciferol)  50,000 Units Oral Weekly   Continuous Infusions:  PRN Meds: sodium chloride, acetaminophen, albuterol, HYDROcodone-acetaminophen, nitroGLYCERIN, ondansetron (ZOFRAN) IV, sodium chloride flush   Vital Signs    Vitals:   02/27/16 1956 02/27/16 2211 02/28/16 0500 02/28/16 0637  BP:  (!) 152/80  124/86  Pulse:  71  86  Resp:  17  17  Temp:  97.8 F (36.6 C)  97.6 F (36.4 C)  TempSrc:  Oral  Oral  SpO2:  97%  95%  Weight: 207 lb 10.8 oz (94.2 kg)  210 lb 3.2 oz (95.3 kg)   Height:        Intake/Output Summary (Last 24 hours) at 02/28/16 0852 Last data filed at 02/28/16 8453  Gross per 24 hour  Intake             1020 ml  Output                0 ml  Net             1020 ml   Filed Weights   02/27/16 0413 02/27/16 1956 02/28/16 0500  Weight: 206 lb 8 oz (93.7 kg) 207 lb 10.8 oz (94.2 kg) 210 lb 3.2 oz (95.3 kg)    Physical Exam      GEN: Well nourished, well developed, in no acute distress.  HEENT: Grossly normal.  Neck: Supple, JVD elevated ~ 6 cm, carotid bruits, or masses. Cardiac: RRR, with friction rib, no murmurs or gallops. No clubbing, cyanosis, edema.  Radials/DP/PT 2+ and equal bilaterally.  Respiratory:  Respirations regular and unlabored, clear to auscultation bilaterally. GI: Soft, nontender, nondistended, BS + x 4. MS: no deformity or atrophy. Skin: warm and dry, no rash. Tattoos and nipple rings noted.  Neuro:  Strength and sensation are intact. Psych: AAOx3.  Normal affect.  Labs    CBC  Recent Labs  02/26/16 0314 02/26/16 0639  WBC 7.2 7.5  HGB 8.2* 7.5*  HCT 24.3* 22.8*  MCV 88.4 88.7  PLT 317 646   Basic Metabolic Panel  Recent Labs  02/27/16 1404 02/28/16 0730  NA 134* 138  K 6.3* 4.9  CL 97* 99*  CO2 15* 17*  GLUCOSE 126* 111*  BUN 130* 128*  CREATININE 16.53* 14.97*  CALCIUM 6.5* 6.6*   Liver Function Tests  Recent Labs  02/26/16 0314  AST 18  ALT 12*  ALKPHOS 61  BILITOT 1.3*  PROT 7.5  ALBUMIN 2.6*    Recent Labs  02/26/16 0314  LIPASE 54*   Cardiac Enzymes  Recent Labs  02/26/16 0639 02/26/16 1001 02/26/16 1555  TROPONINI 0.05* 0.06* 0.06*   BNP Invalid input(s): POCBNP D-Dimer No results for input(s): DDIMER in the last 72 hours. Hemoglobin A1C No results for input(s): HGBA1C in the last 72 hours. Fasting Lipid Panel  Recent Labs  02/26/16 0639  CHOL 132  HDL 35*  LDLCALC 82  TRIG 74  CHOLHDL 3.8   Thyroid Function Tests No results for input(s): TSH, T4TOTAL, T3FREE, THYROIDAB in the last 72 hours.  Invalid input(s): FREET3  Telemetry    Afib, 80's bpm - Personally Reviewed  ECG    n/a - Personally Reviewed  Radiology    No results found.  Cardiac Studies   Echo 02/26/16: Study Conclusions  - Left ventricle: The cavity size was moderately dilated. There was   mild concentric hypertrophy. Systolic function was  mildly   reduced. The estimated ejection fraction was in the range of 45%   to 50%. Features are consistent with a pseudonormal left   ventricular filling pattern, with concomitant abnormal relaxation   and increased filling pressure (grade 2 diastolic dysfunction). - Mitral valve: There was mild regurgitation. - Left atrium: The atrium was moderately dilated. - Pulmonary arteries: Systolic pressure could not be accurately   estimated. - Inferior vena cava: The vessel was severely dilated. The   respirophasic diameter changes were blunted (< 50%), consistent   with elevated central venous pressure. - Pericardium, extracardiac: There was no pericardial effusion.  Patient Profile     36 y.o. male with history of ESRD on PD (followed by [facility]) and hypertension, admitted with acute onset of chest pain on 02/25/16 consistent with pericarditis.  Assessment & Plan    1. Acute pericarditis:  -Continues to note chest pain with inspiration -Continue prednisone -Not a good candidate for NSAIDs or colchicine given ESRD -Could consider a one-time dose of renally dosed colchicine, d/w MD  2. New onset Afib: -Developed this morning around 3:24 AM -Rate controlled currently -On Lopressor for rate control -CHADS2VASc of 1 (HTN) -Defer long term full-dose anticoagulation at this time given the above -Likely in the setting of his pericarditis -As his pericarditis improves consider warfarin (ESRD) in an effort to anticoagulate to restore sinus rhythm -ASA for now  3. Cardiomyopathy: -EF mildly reduced, though may be approximately 55% with evidence of LV dilation -Stress test at [facility] 04/2015 without evidence of ischemia -Minimal troponin elevation without rise and fall is nonspecific and in the setting of ESRD, not c/w ACS -Continues to be volume overloaded -Diuresis per PD  4. Normocytic anemia: -Per IM  5. HTN: -Stable   Signed, Christell Faith, PA-C [facility] Pager: [phone] 02/28/2016, 8:52 AM

## 2016-02-28 NOTE — Consult Note (Signed)
Louis Lee  MRN : [id number]  Louis Lee is a 36 y.o. (1980/08/28) male who presents with chief complaint of  Chief Complaint  Patient presents with  . Chest Pain  .  History of Present Illness: I am asked by Dr. Holley Raring to see the patient regarding excess granulation tissue around his PD exit site.  Recently treated for staph infection.  No erythema or drainage today.  No fever or chills.  Not painful.  Catheter is working well.  This tissue started a few weeks ago after treatment for his staph infection.  No trauma or injury.  Current Facility-Administered Medications  Medication Dose Route Frequency Provider Last Rate Last Dose  . 0.9 %  sodium chloride infusion  250 mL Intravenous PRN Saundra Shelling, MD      . acetaminophen (TYLENOL) tablet 650 mg  650 mg Oral Q4H PRN Pavan Pyreddy, MD      . albuterol (PROVENTIL) (2.5 MG/3ML) 0.083% nebulizer solution 3 mL  3 mL Inhalation Q6H PRN Pavan Pyreddy, MD      . amLODipine (NORVASC) tablet 5 mg  5 mg Oral Daily Saundra Shelling, MD   5 mg at 02/28/16 0957  . aspirin EC tablet 81 mg  81 mg Oral Daily Saundra Shelling, MD   81 mg at 02/28/16 0957  . calcium acetate (PHOSLO) capsule 1,334 mg  1,334 mg Oral TID WC Saundra Shelling, MD   1,334 mg at 02/28/16 1249  . dialysis solution 2.5% low-MG/low-CA dianeal solution   Intraperitoneal Q24H Munsoor Lateef, MD      . dialysis solution 4.25% low-MG/low-CA dianeal solution   Intraperitoneal Q24H Munsoor Lateef, MD      . febuxostat (ULORIC) tablet 40 mg  40 mg Oral Daily Pavan Pyreddy, MD   40 mg at 02/28/16 0957  . furosemide (LASIX) tablet 40 mg  40 mg Oral Daily Saundra Shelling, MD   40 mg at 02/28/16 0956  . gentamicin cream (GARAMYCIN) 0.1 % 1 application  1 application Topical Daily Munsoor Lateef, MD   1 application at 93/26/71 1958  . heparin injection 5,000 Units  5,000 Units Subcutaneous Q8H Saundra Shelling, MD   5,000 Units at 02/28/16 0534  .  HYDROcodone-acetaminophen (NORCO/VICODIN) 5-325 MG per tablet 1 tablet  1 tablet Oral Q4H PRN Epifanio Lesches, MD   1 tablet at 02/28/16 0746  . losartan (COZAAR) tablet 100 mg  100 mg Oral Daily Saundra Shelling, MD   100 mg at 02/28/16 0957  . metoprolol tartrate (LOPRESSOR) tablet 25 mg  25 mg Oral BID Saundra Shelling, MD   25 mg at 02/28/16 0956  . nitroGLYCERIN (NITROSTAT) SL tablet 0.4 mg  0.4 mg Sublingual Q5 Min x 3 PRN Saundra Shelling, MD      . ondansetron (ZOFRAN) injection 4 mg  4 mg Intravenous Q6H PRN Saundra Shelling, MD   4 mg at 02/26/16 2120  . pantoprazole (PROTONIX) EC tablet 40 mg  40 mg Oral Daily Epifanio Lesches, MD   40 mg at 02/28/16 0957  . predniSONE (DELTASONE) tablet 50 mg  50 mg Oral Q breakfast Wellington Hampshire, MD   50 mg at 02/28/16 0854  . sodium bicarbonate tablet 650 mg  650 mg Oral BID Saundra Shelling, MD   650 mg at 02/28/16 0957  . sodium chloride flush (NS) 0.9 % injection 3 mL  3 mL Intravenous Q12H Saundra Shelling, MD   3 mL at 02/28/16 0957  . sodium chloride  flush (NS) 0.9 % injection 3 mL  3 mL Intravenous PRN Saundra Shelling, MD   3 mL at 02/26/16 1951  . Vitamin D (Ergocalciferol) (DRISDOL) capsule 50,000 Units  50,000 Units Oral Weekly Saundra Shelling, MD   50,000 Units at 02/28/16 3382    Past Medical History:  Diagnosis Date  . ESRD on dialysis ([place])   . Hypertension   . Renal disorder    Louis Lee is listed for kidney transplant    Past Surgical History:  Procedure Laterality Date  . GASTRIC BYPASS      Social History Social History  Substance Use Topics  . Smoking status: Never Smoker  . Smokeless tobacco: Never Used  . Alcohol use No  No IVDU  Family History Family History  Problem Relation Age of Onset  . Heart disease Father   . Diabetes Father   . Hypertension Father   No bleeding or clotting disorders  No Known Allergies   REVIEW OF SYSTEMS (Negative unless checked)  Constitutional: [] Weight loss  [] Fever  [] Chills Cardiac:  [] Chest pain   [] Chest pressure   [] Palpitations   [] Shortness of breath when laying flat   [] Shortness of breath at rest   [x] Shortness of breath with exertion. Vascular:  [] Pain in legs with walking   [] Pain in legs at rest   [] Pain in legs when laying flat   [] Claudication   [] Pain in feet when walking  [] Pain in feet at rest  [] Pain in feet when laying flat   [] History of DVT   [] Phlebitis   [x] Swelling in legs   [] Varicose veins   [] Non-healing ulcers Pulmonary:   [] Uses home oxygen   [] Productive cough   [] Hemoptysis   [] Wheeze  [] COPD   [] Asthma Neurologic:  [] Dizziness  [] Blackouts   [] Seizures   [] History of stroke   [] History of TIA  [] Aphasia   [] Temporary blindness   [] Dysphagia   [] Weakness or numbness in arms   [] Weakness or numbness in legs Musculoskeletal:  [] Arthritis   [] Joint swelling   [] Joint pain   [] Low back pain Hematologic:  [] Easy bruising  [] Easy bleeding   [] Hypercoagulable state   [] Anemic  [] Hepatitis Gastrointestinal:  [] Blood in stool   [] Vomiting blood  [] Gastroesophageal reflux/heartburn   [] Difficulty swallowing. Genitourinary:  [x] Chronic kidney disease   [] Difficult urination  [] Frequent urination  [] Burning with urination   [] Blood in urine Skin:  [] Rashes   [] Ulcers   [] Wounds Psychological:  [] History of anxiety   []  History of major depression.  Physical Examination  Vitals:   02/27/16 2211 02/28/16 0500 02/28/16 0637 02/28/16 1110  BP: (!) 152/80  124/86 (!) 152/83  Pulse: 71  86 82  Resp: 17  17 18   Temp: 97.8 F (36.6 C)  97.6 F (36.4 C) 98.2 F (36.8 C)  TempSrc: Oral  Oral Oral  SpO2: 97%  95% 97%  Weight:  95.3 kg (210 lb 3.2 oz)    Height:       Body mass index is 36.08 kg/m. Gen:  WD/WN, NAD Head: [place]/AT, No temporalis wasting. Prominent temp pulse not noted. Ear/Nose/Throat: Hearing grossly intact, nares w/o erythema or drainage, oropharynx w/o Erythema/Exudate Eyes: Sclera non-icteric, conjunctiva clear Neck: Trachea midline.  No  JVD.  Pulmonary:  Good air movement, respirations not labored, equal bilaterally.  Cardiac: RRR, normal S1, S2. Vascular:  Vessel Right Left  Radial Palpable Palpable  Gastrointestinal: soft, non-tender/non-distended. No guarding/reflex.  Musculoskeletal: no deformities. 1+ LE edema. Neurologic: Moves all four.  Symmetrical.  Speech is fluent.  Psychiatric: Judgment intact, Mood & affect appropriate for Louis Lee's clinical situation. Dermatologic: No rashes or ulcers noted.  No cellulitis or open wounds. Lymph : No Cervical, Axillary, or Inguinal lymphadenopathy.      CBC Lab Results  Component Value Date   WBC 7.5 02/26/2016   HGB 7.5 (L) 02/26/2016   HCT 22.8 (L) 02/26/2016   MCV 88.7 02/26/2016   PLT 275 02/26/2016    BMET    Component Value Date/Time   NA 138 02/28/2016 0730   NA 139 06/09/2012 0344   K 4.9 02/28/2016 0730   K 4.7 06/09/2012 0344   CL 99 (L) 02/28/2016 0730   CL 114 (H) 06/09/2012 0344   CO2 17 (L) 02/28/2016 0730   CO2 19 (L) 06/09/2012 0344   GLUCOSE 111 (H) 02/28/2016 0730   GLUCOSE 90 06/09/2012 0344   BUN 128 (H) 02/28/2016 0730   BUN 33 (H) 06/09/2012 0344   CREATININE 14.97 (H) 02/28/2016 0730   CREATININE 2.34 (H) 06/09/2012 0344   CALCIUM 6.6 (L) 02/28/2016 0730   CALCIUM 7.9 (L) 06/09/2012 0344   GFRNONAA 4 (L) 02/28/2016 0730   GFRNONAA 35 (L) 06/09/2012 0344   GFRAA 4 (L) 02/28/2016 0730   GFRAA 41 (L) 06/09/2012 0344   Estimated Creatinine Clearance: 7.1 mL/min (by C-G formula based on SCr of 14.97 mg/dL (H)).  COAG Lab Results  Component Value Date   INR 1.19 01/30/2016    Radiology Dg Chest 2 View  Result Date: 01/29/2016 CLINICAL DATA:  Cough and chills for 2 days EXAM: CHEST  2 VIEW COMPARISON:  03/08/2015 FINDINGS: Cardiac shadow is at the upper limits of normal in size. The lungs are well aerated bilaterally and demonstrate some patchy infiltrative changes bilaterally. No sizable  effusion is seen. No acute bony abnormality is noted. IMPRESSION: Patchy infiltrates bilaterally slightly worse on the right than the left. Electronically Signed   By: Inez Catalina M.D.   On: 01/29/2016 18:54   Dg Chest Port 1 View  Result Date: 02/26/2016 CLINICAL DATA:  Acute onset of left-sided chest pain and nausea. Shortness of breath. Initial encounter. EXAM: PORTABLE CHEST 1 VIEW COMPARISON:  Chest radiograph performed 01/29/2016 FINDINGS: The lungs are mildly hypoexpanded. Vascular congestion is noted. Increased interstitial markings raise concern for pulmonary edema. No definite pleural effusion or pneumothorax is seen. The cardiomediastinal silhouette is mildly enlarged. No acute osseous abnormalities are seen. IMPRESSION: Lungs mildly hypoexpanded. Vascular congestion and mild cardiomegaly. Increased interstitial markings raise concern for pulmonary edema. Electronically Signed   By: Garald Balding M.D.   On: 02/26/2016 03:59      Assessment/Plan 1. Excess granulation tissue around PD catheter site.  Will place silver nitrate on this.  Will likely resolved over next couple of weeks 2. ESRD. Increasing PD and if that doesn't work may need HD.  Will be happy to place permcath if needed for HD 3. HTN. Stable on outpatient medications 4. Anemia of chronic disease.  No indication for transfusion today.   Leotis Pain, MD  02/28/2016 1:05 PM    This Lee was created with Dragon medical transcription system.  Any error is purely unintentional

## 2016-02-28 NOTE — Progress Notes (Signed)
Pre PD assessment 

## 2016-02-28 NOTE — Progress Notes (Signed)
[facility]  ROUNDING NOTE   Subjective:  Patient did not have very much ultrafiltration with peritoneal dialysis last night. Net ultrafiltration was 385 cc. We talked about intensifying his peritoneal dialysis prescription a bit further today. He continues to have some chest pain.   Objective:  Vital signs in last 24 hours:  Temp:  [97.6 F (36.4 C)-98.2 F (36.8 C)] 98.2 F (36.8 C) (01/10 1110) Pulse Rate:  [71-86] 82 (01/10 1110) Resp:  [11-18] 18 (01/10 1110) BP: (124-152)/(71-86) 152/83 (01/10 1110) SpO2:  [95 %-97 %] 97 % (01/10 1110) Weight:  [94.2 kg (207 lb 10.8 oz)-95.3 kg (210 lb 3.2 oz)] 95.3 kg (210 lb 3.2 oz) (01/10 0500)  Weight change: 0.532 kg (1 lb 2.8 oz) Filed Weights   02/27/16 0413 02/27/16 1956 02/28/16 0500  Weight: 93.7 kg (206 lb 8 oz) 94.2 kg (207 lb 10.8 oz) 95.3 kg (210 lb 3.2 oz)    Intake/Output: I/O last 3 completed shifts: In: 1020 [P.O.:1020] Out: 637 [Urine:100; Other:537]   Intake/Output this shift:  Total I/O In: 240 [P.O.:240] Out: -   Physical Exam: General: No acute distress  Head: Normocephalic, atraumatic. Moist oral mucosal membranes  Eyes: Anicteric  Neck: Supple, trachea midline  Lungs:  Basilar rales, normal effort  Heart: S1S2, rub not audible at the moment  Abdomen:  Soft, nontender, bowel sounds present  Extremities: 1+ peripheral edema.  Neurologic: Nonfocal, moving all four extremities  Skin: No lesions  Access: PD catheter in place, area of proud flesh next to exit site    Basic Metabolic Panel:  Recent Labs Lab 02/26/16 0314 02/26/16 0639 02/26/16 1001 02/27/16 1404 02/28/16 0730  NA 136 135  --  134* 138  K 5.5* 6.2* 5.5* 6.3* 4.9  CL 100* 98*  --  97* 99*  CO2 13* 15*  --  15* 17*  GLUCOSE 102* 102*  --  126* 111*  BUN 127* 132*  --  130* 128*  CREATININE 16.82* 17.33*  --  16.53* 14.97*  CALCIUM 6.3* 6.1*  --  6.5* 6.6*    Liver Function Tests:  Recent Labs Lab  02/26/16 0314  AST 18  ALT 12*  ALKPHOS 61  BILITOT 1.3*  PROT 7.5  ALBUMIN 2.6*    Recent Labs Lab 02/26/16 0314  LIPASE 54*   No results for input(s): AMMONIA in the last 168 hours.  CBC:  Recent Labs Lab 02/26/16 0314 02/26/16 0639  WBC 7.2 7.5  HGB 8.2* 7.5*  HCT 24.3* 22.8*  MCV 88.4 88.7  PLT 317 275    Cardiac Enzymes:  Recent Labs Lab 02/26/16 0314 02/26/16 0639 02/26/16 1001 02/26/16 1555  TROPONINI 0.05* 0.05* 0.06* 0.06*    BNP: Invalid input(s): POCBNP  CBG:  Recent Labs Lab 02/26/16 1636 02/27/16 0749 02/27/16 1131 02/27/16 1614 02/27/16 2116  GLUCAP 138* 106* 189* 138* 168*    Microbiology: Results for orders placed or performed during the hospital encounter of 01/29/16  Blood culture (routine x 2)     Status: None   Collection Time: 01/29/16  8:18 PM  Result Value Ref Range Status   Specimen Description BLOOD RIGHT [facility]  Final   Special Requests   Final    BOTTLES DRAWN AEROBIC AND ANAEROBIC ANA12CC [id number]   Culture NO GROWTH 5 DAYS  Final   Report Status 02/03/2016 FINAL  Final  Blood culture (routine x 2)     Status: None   Collection Time: 01/29/16  8:18 PM  Result  Value Ref Range Status   Specimen Description BLOOD LEFT AC  Final   Special Requests BOTTLES DRAWN AEROBIC AND ANAEROBIC [facility] [id number]  Final   Culture NO GROWTH 5 DAYS  Final   Report Status 02/03/2016 FINAL  Final  Body fluid culture     Status: None   Collection Time: 01/31/16  8:38 AM  Result Value Ref Range Status   Specimen Description PERITONEAL  Final   Special Requests Normal  Final   Gram Stain NO WBC SEEN NO ORGANISMS SEEN   Final   Culture   Final    NO GROWTH 3 DAYS Performed at [facility]    Report Status 02/03/2016 FINAL  Final  Aerobic Culture (superficial specimen)     Status: None   Collection Time: 01/31/16  8:50 AM  Result Value Ref Range Status   Specimen Description ABDOMEN  Final   Special Requests Normal  Final    Gram Stain   Final    MODERATE WBC PRESENT, PREDOMINANTLY PMN FEW GRAM POSITIVE COCCI Performed at [place]  Final   Report Status 02/02/2016 FINAL  Final   Organism ID, Bacteria STAPHYLOCOCCUS AUREUS  Final      Susceptibility   Staphylococcus aureus - MIC*    CIPROFLOXACIN >=8 RESISTANT Resistant     ERYTHROMYCIN <=0.25 SENSITIVE Sensitive     GENTAMICIN <=0.5 SENSITIVE Sensitive     OXACILLIN 0.5 SENSITIVE Sensitive     TETRACYCLINE <=1 SENSITIVE Sensitive     VANCOMYCIN <=0.5 SENSITIVE Sensitive     TRIMETH/SULFA <=10 SENSITIVE Sensitive     CLINDAMYCIN <=0.25 SENSITIVE Sensitive     RIFAMPIN <=0.5 SENSITIVE Sensitive     Inducible Clindamycin NEGATIVE Sensitive     * MODERATE STAPHYLOCOCCUS AUREUS    Coagulation Studies: No results for input(s): LABPROT, INR in the last 72 hours.  Urinalysis: No results for input(s): COLORURINE, LABSPEC, PHURINE, GLUCOSEU, HGBUR, BILIRUBINUR, KETONESUR, PROTEINUR, UROBILINOGEN, NITRITE, LEUKOCYTESUR in the last 72 hours.  Invalid input(s): APPERANCEUR    Imaging: No results found.   Medications:    . amLODipine  5 mg Oral Daily  . aspirin EC  81 mg Oral Daily  . calcium acetate  1,334 mg Oral TID WC  . dialysis solution 2.5% low-MG/low-CA   Intraperitoneal Q24H  . febuxostat  40 mg Oral Daily  . furosemide  40 mg Oral Daily  . gentamicin cream  1 application Topical Daily  . heparin  5,000 Units Subcutaneous Q8H  . losartan  100 mg Oral Daily  . metoprolol tartrate  25 mg Oral BID  . pantoprazole  40 mg Oral Daily  . predniSONE  50 mg Oral Q breakfast  . sodium bicarbonate  650 mg Oral BID  . sodium chloride flush  3 mL Intravenous Q12H  . Vitamin D (Ergocalciferol)  50,000 Units Oral Weekly   sodium chloride, acetaminophen, albuterol, HYDROcodone-acetaminophen, nitroGLYCERIN, ondansetron (ZOFRAN) IV, sodium chloride flush  Assessment/ Plan:  36 y.o. 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   [facility]/PD  1. End-stage renal disease on PD. 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  BUN/Cr quite high. -We will intensify  the patient's peritoneal dialysis prescription. We will place him on 10 hours with 6 exchanges. In addition we will use 4.25% dextrose solution.  2. Anemia of chronic kidney disease Patient administered Epogen 02/27/2016.  3. Hyperkalemia:  Potassium down to 4.9. Continue to monitor.   4. Shortness of breath/chest pain:  Suspect possible uremic pericarditis.  We offered the patient the potential for hemodialysis to help treat this a bit more effectively. He would like to intensify the peritoneal dialysis prescription first.  LOS: 2 Louis Lee 1/10/201811:51 AM

## 2016-02-29 DIAGNOSIS — I308 Other forms of acute pericarditis: Secondary | ICD-10-CM

## 2016-02-29 LAB — GLUCOSE, CAPILLARY: Glucose-Capillary: 106 mg/dL — ABNORMAL HIGH (ref 65–99)

## 2016-02-29 MED ORDER — PREDNISONE 10 MG PO TABS: ORAL_TABLET | ORAL | 0 refills | Status: AC

## 2016-02-29 MED ORDER — ASPIRIN 81 MG PO TBEC: 81.0000 mg | DELAYED_RELEASE_TABLET | Freq: Every day | ORAL | 0 refills | Status: AC

## 2016-02-29 MED ORDER — OXYCODONE-ACETAMINOPHEN 7.5-325 MG PO TABS
2.0000 | ORAL_TABLET | Freq: Four times a day (QID) | ORAL | Status: DC | PRN
  Administered 2016-02-29 – 2016-03-01 (×4): 2 via ORAL
  Filled 2016-02-29 (×4): qty 2

## 2016-02-29 MED ORDER — OXYCODONE-ACETAMINOPHEN 2.5-325 MG PO TABS: 1.0000 | ORAL_TABLET | Freq: Four times a day (QID) | ORAL | 0 refills | Status: DC | PRN

## 2016-02-29 NOTE — Progress Notes (Signed)
[facility]  ROUNDING NOTE   Subjective:  Patient had better peritoneal dialysis last night. 1.2 L of ultrafiltration was achieved. Patient still having some chest pain.   Objective:  Vital signs in last 24 hours:  Temp:  [97.9 F (36.6 C)-98.2 F (36.8 C)] 97.9 F (36.6 C) (01/11 1105) Pulse Rate:  [69-87] 69 (01/11 1105) Resp:  [18-20] 20 (01/11 1105) BP: (135-144)/(77-85) 138/77 (01/11 1105) SpO2:  [95 %-97 %] 97 % (01/11 1105) Weight:  [96 kg (211 lb 10.3 oz)-100.1 kg (220 lb 9.6 oz)] 100.1 kg (220 lb 9.6 oz) (01/11 0337)  Weight change: 1.8 kg (3 lb 15.5 oz) Filed Weights   02/28/16 0500 02/28/16 2112 02/29/16 0337  Weight: 95.3 kg (210 lb 3.2 oz) 96 kg (211 lb 10.3 oz) 100.1 kg (220 lb 9.6 oz)    Intake/Output: I/O last 3 completed shifts: In: 780 [P.O.:780] Out: 1060 [Urine:675; Other:385]   Intake/Output this shift:  Total I/O In: 240 [P.O.:240] Out: 1251 [Other:1251]  Physical Exam: General: No acute distress  Head: Normocephalic, atraumatic. Moist oral mucosal membranes  Eyes: Anicteric  Neck: Supple, trachea midline  Lungs:  Basilar rales, normal effort  Heart: S1S2, friction rub heard  Abdomen:  Soft, nontender, bowel sounds present  Extremities: 1+ peripheral edema.  Neurologic: Nonfocal, moving all four extremities  Skin: No lesions  Access: PD catheter in place, area of proud flesh next to exit site    Basic Metabolic Panel:  Recent Labs Lab 02/26/16 0314 02/26/16 0639 02/26/16 1001 02/27/16 1404 02/28/16 0730  NA 136 135  --  134* 138  K 5.5* 6.2* 5.5* 6.3* 4.9  CL 100* 98*  --  97* 99*  CO2 13* 15*  --  15* 17*  GLUCOSE 102* 102*  --  126* 111*  BUN 127* 132*  --  130* 128*  CREATININE 16.82* 17.33*  --  16.53* 14.97*  CALCIUM 6.3* 6.1*  --  6.5* 6.6*    Liver Function Tests:  Recent Labs Lab 02/26/16 0314  AST 18  ALT 12*  ALKPHOS 61  BILITOT 1.3*  PROT 7.5  ALBUMIN 2.6*    Recent Labs Lab  02/26/16 0314  LIPASE 54*   No results for input(s): AMMONIA in the last 168 hours.  CBC:  Recent Labs Lab 02/26/16 0314 02/26/16 0639  WBC 7.2 7.5  HGB 8.2* 7.5*  HCT 24.3* 22.8*  MCV 88.4 88.7  PLT 317 275    Cardiac Enzymes:  Recent Labs Lab 02/26/16 0314 02/26/16 0639 02/26/16 1001 02/26/16 1555  TROPONINI 0.05* 0.05* 0.06* 0.06*    BNP: Invalid input(s): POCBNP  CBG:  Recent Labs Lab 02/27/16 0749 02/27/16 1131 02/27/16 1614 02/27/16 2116 02/28/16 0743  GLUCAP 106* 189* 138* 168* 106*    Microbiology: Results for orders placed or performed during the hospital encounter of 01/29/16  Blood culture (routine x 2)     Status: None   Collection Time: 01/29/16  8:18 PM  Result Value Ref Range Status   Specimen Description BLOOD RIGHT [facility]  Final   Special Requests   Final    BOTTLES DRAWN AEROBIC AND ANAEROBIC ANA12CC [id number]   Culture NO GROWTH 5 DAYS  Final   Report Status 02/03/2016 FINAL  Final  Blood culture (routine x 2)     Status: None   Collection Time: 01/29/16  8:18 PM  Result Value Ref Range Status   Specimen Description BLOOD LEFT AC  Final   Special Requests BOTTLES DRAWN AEROBIC  AND ANAEROBIC [facility] [id number]  Final   Culture NO GROWTH 5 DAYS  Final   Report Status 02/03/2016 FINAL  Final  Body fluid culture     Status: None   Collection Time: 01/31/16  8:38 AM  Result Value Ref Range Status   Specimen Description PERITONEAL  Final   Special Requests Normal  Final   Gram Stain NO WBC SEEN NO ORGANISMS SEEN   Final   Culture   Final    NO GROWTH 3 DAYS Performed at [facility]    Report Status 02/03/2016 FINAL  Final  Aerobic Culture (superficial specimen)     Status: None   Collection Time: 01/31/16  8:50 AM  Result Value Ref Range Status   Specimen Description ABDOMEN  Final   Special Requests Normal  Final   Gram Stain   Final    MODERATE WBC PRESENT, PREDOMINANTLY PMN FEW GRAM POSITIVE COCCI Performed at [place]  Final   Report Status 02/02/2016 FINAL  Final   Organism ID, Bacteria STAPHYLOCOCCUS AUREUS  Final      Susceptibility   Staphylococcus aureus - MIC*    CIPROFLOXACIN >=8 RESISTANT Resistant     ERYTHROMYCIN <=0.25 SENSITIVE Sensitive     GENTAMICIN <=0.5 SENSITIVE Sensitive     OXACILLIN 0.5 SENSITIVE Sensitive     TETRACYCLINE <=1 SENSITIVE Sensitive     VANCOMYCIN <=0.5 SENSITIVE Sensitive     TRIMETH/SULFA <=10 SENSITIVE Sensitive     CLINDAMYCIN <=0.25 SENSITIVE Sensitive     RIFAMPIN <=0.5 SENSITIVE Sensitive     Inducible Clindamycin NEGATIVE Sensitive     * MODERATE STAPHYLOCOCCUS AUREUS    Coagulation Studies: No results for input(s): LABPROT, INR in the last 72 hours.  Urinalysis: No results for input(s): COLORURINE, LABSPEC, PHURINE, GLUCOSEU, HGBUR, BILIRUBINUR, KETONESUR, PROTEINUR, UROBILINOGEN, NITRITE, LEUKOCYTESUR in the last 72 hours.  Invalid input(s): APPERANCEUR    Imaging: No results found.   Medications:    . amLODipine  5 mg Oral Daily  . aspirin EC  81 mg Oral Daily  . calcium acetate  1,334 mg Oral TID WC  . dialysis solution 2.5% low-MG/low-CA   Intraperitoneal Q24H  . dialysis solution 4.25% low-MG/low-CA   Intraperitoneal Q24H  . febuxostat  40 mg Oral Daily  . furosemide  40 mg Oral Daily  . gentamicin cream  1 application Topical Daily  . heparin  5,000 Units Subcutaneous Q8H  . losartan  100 mg Oral Daily  . metoprolol tartrate  25 mg Oral BID  . pantoprazole  40 mg Oral Daily  . predniSONE  50 mg Oral Q breakfast  . sodium bicarbonate  650 mg Oral BID  . sodium chloride flush  3 mL Intravenous Q12H  . Vitamin D (Ergocalciferol)  50,000 Units Oral Weekly   sodium chloride, acetaminophen, albuterol, nitroGLYCERIN, ondansetron (ZOFRAN) IV, oxyCODONE-acetaminophen, sodium chloride flush  Assessment/ Plan:  36 y.o. 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   [facility]/PD  1. End-stage renal disease on PD. 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  BUN/Cr quite high. -Patient did better with peritoneal dialysis last night. Continue peritoneal dialysis with 6  exchanges, 4.25% dextrose solution. Ultrafiltration achieved last night was 1.2 L.  2. Anemia of chronic kidney disease Patient administered Epogen 02/27/2016.  Continue to monitor hemoglobin.  3. Hyperkalemia:  No new potassium today however potassium was down to 4.9 yesterday.  4. Shortness of breath/chest pain:  Suspect possible uremic pericarditis.  We offered the patient the potential for hemodialysis to help treat this a bit more effectively. As before patient would like to continue with peritoneal dialysis for now.   LOS: 3 Lars Jeziorski 1/11/20181:38 PM

## 2016-02-29 NOTE — Progress Notes (Signed)
CCPD initiated without issue. 4.24% dextrose solution per MD order. Patient currently has no complaints.
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
## 2016-02-29 NOTE — Plan of Care (Signed)
Problem: Pain Managment: Goal: General experience of comfort will improve Outcome: Not Progressing Despite receiving pain medication throughout the day, patient does not seem to receive comfort from any pain medications. Patient constantly rates pain 9/10 even after receiving medications.

## 2016-02-29 NOTE — Progress Notes (Signed)
Patient Name: Louis Lee Date of Encounter: 02/29/2016  Primary Cardiologist: [facility] Problem List     Principal Problem:   Acute pericarditis Active Problems:   Chest pain   Hyperkalemia   Hypertension   Cardiomyopathy ([place])   Normocytic anemia     Subjective   Patient up sitting in his bed on his phone upon entering his room. Upon stepping into his room this morning he drops his phone, lays his head back, and states " I'm in so much pain." Has been started on IV Dilaudid per IM for pain. AM dose this morning of Dilaudid at 6:40.   Inpatient Medications    Scheduled Meds: . amLODipine  5 mg Oral Daily  . aspirin EC  81 mg Oral Daily  . calcium acetate  1,334 mg Oral TID WC  . dialysis solution 2.5% low-MG/low-CA   Intraperitoneal Q24H  . dialysis solution 4.25% low-MG/low-CA   Intraperitoneal Q24H  . febuxostat  40 mg Oral Daily  . furosemide  40 mg Oral Daily  . gentamicin cream  1 application Topical Daily  . heparin  5,000 Units Subcutaneous Q8H  . losartan  100 mg Oral Daily  . metoprolol tartrate  25 mg Oral BID  . pantoprazole  40 mg Oral Daily  . predniSONE  50 mg Oral Q breakfast  . sodium bicarbonate  650 mg Oral BID  . sodium chloride flush  3 mL Intravenous Q12H  . Vitamin D (Ergocalciferol)  50,000 Units Oral Weekly   Continuous Infusions:  PRN Meds: sodium chloride, acetaminophen, albuterol, HYDROmorphone (DILAUDID) injection, nitroGLYCERIN, ondansetron (ZOFRAN) IV, sodium chloride flush   Vital Signs    Vitals:   02/28/16 1110 02/28/16 2033 02/28/16 2112 02/29/16 0337  BP: (!) 152/83 (!) 144/85  135/82  Pulse: 82 87  71  Resp: 18 18  18   Temp: 98.2 F (36.8 C) 98 F (36.7 C)  98.2 F (36.8 C)  TempSrc: Oral Oral  Oral  SpO2: 97% 95%  95%  Weight:   211 lb 10.3 oz (96 kg) 220 lb 9.6 oz (100.1 kg)  Height:        Intake/Output Summary (Last 24 hours) at 02/29/16 0823 Last data filed at 02/29/16 0735  Gross per 24  hour  Intake              480 ml  Output              675 ml  Net             -195 ml   Filed Weights   02/28/16 0500 02/28/16 2112 02/29/16 0337  Weight: 210 lb 3.2 oz (95.3 kg) 211 lb 10.3 oz (96 kg) 220 lb 9.6 oz (100.1 kg)    Physical Exam    GEN: Well nourished, well developed, in no acute distress.  HEENT: Grossly normal.  Neck: Supple, no JVD, carotid bruits, or masses. Cardiac: RRR, no murmurs, rubs, or gallops. No clubbing, cyanosis, edema.  Radials/DP/PT 2+ and equal bilaterally. Anterior chest wall TTP.  Respiratory:  Respirations regular and unlabored, clear to auscultation bilaterally. GI: Soft, nontender, nondistended, BS + x 4. MS: no deformity or atrophy. Skin: warm and dry, no rash. Neuro:  Strength and sensation are intact. Psych: AAOx3.  Normal affect.  Labs    CBC No results for input(s): WBC, NEUTROABS, HGB, HCT, MCV, PLT in the last 72 hours. Basic Metabolic Panel  Recent Labs  02/27/16 1404 02/28/16  0730  NA 134* 138  K 6.3* 4.9  CL 97* 99*  CO2 15* 17*  GLUCOSE 126* 111*  BUN 130* 128*  CREATININE 16.53* 14.97*  CALCIUM 6.5* 6.6*   Liver Function Tests No results for input(s): AST, ALT, ALKPHOS, BILITOT, PROT, ALBUMIN in the last 72 hours. No results for input(s): LIPASE, AMYLASE in the last 72 hours. Cardiac Enzymes  Recent Labs  02/26/16 1001 02/26/16 1555  TROPONINI 0.06* 0.06*   BNP Invalid input(s): POCBNP D-Dimer No results for input(s): DDIMER in the last 72 hours. Hemoglobin A1C No results for input(s): HGBA1C in the last 72 hours. Fasting Lipid Panel No results for input(s): CHOL, HDL, LDLCALC, TRIG, CHOLHDL, LDLDIRECT in the last 72 hours. Thyroid Function Tests No results for input(s): TSH, T4TOTAL, T3FREE, THYROIDAB in the last 72 hours.  Invalid input(s): FREET3  Telemetry    NSR, 80's bpm, converted back to sinus rhythm with heart rate in the 80's bpm around noon on 1/10 - Personally Reviewed  ECG    n/a -  Personally Reviewed  Radiology    No results found.  Cardiac Studies   Echo 02/26/16: Study Conclusions  - Left ventricle: The cavity size was moderately dilated. There was mild concentric hypertrophy. Systolic function was mildly reduced. The estimated ejection fraction was in the range of 45% to 50%. Features are consistent with a pseudonormal left ventricular filling pattern, with concomitant abnormal relaxation and increased filling pressure (grade 2 diastolic dysfunction). - Mitral valve: There was mild regurgitation. - Left atrium: The atrium was moderately dilated. - Pulmonary arteries: Systolic pressure could not be accurately estimated. - Inferior vena cava: The vessel was severely dilated. The respirophasic diameter changes were blunted (<50%), consistent with elevated central venous pressure. - Pericardium, extracardiac: There was no pericardial effusion.  Patient Profile     36 y.o. male with history of ESRD on PD (followed by [facility]) and hypertension, admitted with acute onset of chest pain on 02/25/16 consistent with pericarditis.  Assessment & Plan    1. Acute pericarditis:  -Continues to note chest pain with inspiration and to palpation -Patient's entire demeanor changes upon me entering the room -Cannot rule out narcotic seeking at this time -Continue prednisone -Not a good candidate for NSAIDs or colchicine given ESRD -Could consider a one-time dose of renally dosed colchicine, d/w MD  2. New onset Afib: -Back in sinus rhythm around noon on 1/10 -Rate controlled currently -On Lopressor for rate control -CHADS2VASc of 1 (HTN) -Defer long term full-dose anticoagulation at this time given the above -Likely in the setting of his pericarditis and electrolyte abnormalities  -ASA for now  3. Cardiomyopathy: -EF mildly reduced, though may be approximately 55% with evidence of LV dilation -Stress test at [facility] 04/2015 without evidence of  ischemia -Minimal troponin elevation without rise and fall is nonspecific and in the setting of ESRD, not c/w ACS -Continues to be volume overloaded -Diuresis per PD  4. Normocytic anemia: -Per IM  5. HTN: -Stable   Signed, Christell Faith, PA-C [place] Pager: [phone] 02/29/2016, 8:23 AM

## 2016-02-29 NOTE — Progress Notes (Addendum)
CCPD discontinued without issue. UF 1251 mL. Dressing changed and gent cream applied. Report given to [facility]. Patient currently has no complaints. Need to get standing weight, patient did not feel like standing this morning.

## 2016-02-29 NOTE — Progress Notes (Signed)
Pre CCPD assessment   

## 2016-02-29 NOTE — Progress Notes (Signed)
Post CCPD assessment

## 2016-03-01 LAB — BASIC METABOLIC PANEL
Anion gap: 20 — ABNORMAL HIGH (ref 5–15)
BUN: 108 mg/dL — AB (ref 6–20)
CHLORIDE: 98 mmol/L — AB (ref 101–111)
CO2: 21 mmol/L — AB (ref 22–32)
Calcium: 7.3 mg/dL — ABNORMAL LOW (ref 8.9–10.3)
Creatinine, Ser: 12.6 mg/dL — ABNORMAL HIGH (ref 0.61–1.24)
GFR calc Af Amer: 5 mL/min — ABNORMAL LOW (ref >60)
GFR calc non Af Amer: 4 mL/min — ABNORMAL LOW (ref >60)
Glucose, Bld: 115 mg/dL — ABNORMAL HIGH (ref 65–99)
Potassium: 4.4 mmol/L (ref 3.5–5.1)
Sodium: 139 mmol/L (ref 135–145)

## 2016-03-01 MED ORDER — OXYCODONE-ACETAMINOPHEN 7.5-325 MG PO TABS: 2.0000 | ORAL_TABLET | Freq: Four times a day (QID) | ORAL | 0 refills | Status: AC | PRN

## 2016-03-01 NOTE — Progress Notes (Signed)
[facility]  ROUNDING NOTE   Subjective:  Patient had peritoneal dialysis overnight. He tolerated this quite well. Ultrafiltration she was 2.5 kg. He continues to have intermittent chest pain. We counseled the patient that he should perform peritoneal dialysis nightly. He may return to work on 03/25/16.   Objective:  Vital signs in last 24 hours:  Temp:  [97.4 F (36.3 C)-98.5 F (36.9 C)] 98.5 F (36.9 C) (01/12 1144) Pulse Rate:  [75-86] 75 (01/12 1144) Resp:  [15-18] 18 (01/12 1144) BP: (136-161)/(81-99) 136/82 (01/12 1144) SpO2:  [94 %-100 %] 94 % (01/12 1144) Weight:  [98.1 kg (216 lb 3.2 oz)] 98.1 kg (216 lb 3.2 oz) (01/12 0930)  Weight change:  Filed Weights   02/28/16 2112 02/29/16 0337 03/01/16 0930  Weight: 96 kg (211 lb 10.3 oz) 100.1 kg (220 lb 9.6 oz) 98.1 kg (216 lb 3.2 oz)    Intake/Output: I/O last 3 completed shifts: In: 240 [P.O.:240] Out: 2526 [VHQIO:9629; Other:1251]   Intake/Output this shift:  Total I/O In: 240 [P.O.:240] Out: 2621 [Other:2621]  Physical Exam: General: No acute distress  Head: Normocephalic, atraumatic. Moist oral mucosal membranes  Eyes: Anicteric  Neck: Supple, trachea midline  Lungs:  Basilar rales, normal effort  Heart: S1S2, friction rub heard  Abdomen:  Soft, nontender, bowel sounds present  Extremities: 1+ peripheral edema.  Neurologic: Nonfocal, moving all four extremities  Skin: No lesions  Access: PD catheter in place, area of proud flesh next to exit site    Basic Metabolic Panel:  Recent Labs Lab 02/26/16 0314 02/26/16 0639 02/26/16 1001 02/27/16 1404 02/28/16 0730 03/01/16 0838  NA 136 135  --  134* 138 139  K 5.5* 6.2* 5.5* 6.3* 4.9 4.4  CL 100* 98*  --  97* 99* 98*  CO2 13* 15*  --  15* 17* 21*  GLUCOSE 102* 102*  --  126* 111* 115*  BUN 127* 132*  --  130* 128* 108*  CREATININE 16.82* 17.33*  --  16.53* 14.97* 12.60*  CALCIUM 6.3* 6.1*  --  6.5* 6.6* 7.3*    Liver Function  Tests:  Recent Labs Lab 02/26/16 0314  AST 18  ALT 12*  ALKPHOS 61  BILITOT 1.3*  PROT 7.5  ALBUMIN 2.6*    Recent Labs Lab 02/26/16 0314  LIPASE 54*   No results for input(s): AMMONIA in the last 168 hours.  CBC:  Recent Labs Lab 02/26/16 0314 02/26/16 0639  WBC 7.2 7.5  HGB 8.2* 7.5*  HCT 24.3* 22.8*  MCV 88.4 88.7  PLT 317 275    Cardiac Enzymes:  Recent Labs Lab 02/26/16 0314 02/26/16 0639 02/26/16 1001 02/26/16 1555  TROPONINI 0.05* 0.05* 0.06* 0.06*    BNP: Invalid input(s): POCBNP  CBG:  Recent Labs Lab 02/27/16 0749 02/27/16 1131 02/27/16 1614 02/27/16 2116 02/28/16 0743  GLUCAP 106* 189* 138* 168* 106*    Microbiology: Results for orders placed or performed during the hospital encounter of 01/29/16  Blood culture (routine x 2)     Status: None   Collection Time: 01/29/16  8:18 PM  Result Value Ref Range Status   Specimen Description BLOOD RIGHT [facility]  Final   Special Requests   Final    BOTTLES DRAWN AEROBIC AND ANAEROBIC ANA12CC [id number]   Culture NO GROWTH 5 DAYS  Final   Report Status 02/03/2016 FINAL  Final  Blood culture (routine x 2)     Status: None   Collection Time: 01/29/16  8:18 PM  Result Value Ref Range Status   Specimen Description BLOOD LEFT AC  Final   Special Requests BOTTLES DRAWN AEROBIC AND ANAEROBIC [facility] [id number]  Final   Culture NO GROWTH 5 DAYS  Final   Report Status 02/03/2016 FINAL  Final  Body fluid culture     Status: None   Collection Time: 01/31/16  8:38 AM  Result Value Ref Range Status   Specimen Description PERITONEAL  Final   Special Requests Normal  Final   Gram Stain NO WBC SEEN NO ORGANISMS SEEN   Final   Culture   Final    NO GROWTH 3 DAYS Performed at [facility]    Report Status 02/03/2016 FINAL  Final  Aerobic Culture (superficial specimen)     Status: None   Collection Time: 01/31/16  8:50 AM  Result Value Ref Range Status   Specimen Description ABDOMEN  Final   Special  Requests Normal  Final   Gram Stain   Final    MODERATE WBC PRESENT, PREDOMINANTLY PMN FEW GRAM POSITIVE COCCI Performed at [facility]  Final   Report Status 02/02/2016 FINAL  Final   Organism ID, Bacteria STAPHYLOCOCCUS AUREUS  Final      Susceptibility   Staphylococcus aureus - MIC*    CIPROFLOXACIN >=8 RESISTANT Resistant     ERYTHROMYCIN <=0.25 SENSITIVE Sensitive     GENTAMICIN <=0.5 SENSITIVE Sensitive     OXACILLIN 0.5 SENSITIVE Sensitive     TETRACYCLINE <=1 SENSITIVE Sensitive     VANCOMYCIN <=0.5 SENSITIVE Sensitive     TRIMETH/SULFA <=10 SENSITIVE Sensitive     CLINDAMYCIN <=0.25 SENSITIVE Sensitive     RIFAMPIN <=0.5 SENSITIVE Sensitive     Inducible Clindamycin NEGATIVE Sensitive     * MODERATE STAPHYLOCOCCUS AUREUS    Coagulation Studies: No results for input(s): LABPROT, INR in the last 72 hours.  Urinalysis: No results for input(s): COLORURINE, LABSPEC, PHURINE, GLUCOSEU, HGBUR, BILIRUBINUR, KETONESUR, PROTEINUR, UROBILINOGEN, NITRITE, LEUKOCYTESUR in the last 72 hours.  Invalid input(s): APPERANCEUR    Imaging: No results found.   Medications:    . amLODipine  5 mg Oral Daily  . aspirin EC  81 mg Oral Daily  . calcium acetate  1,334 mg Oral TID WC  . dialysis solution 2.5% low-MG/low-CA   Intraperitoneal Q24H  . dialysis solution 4.25% low-MG/low-CA   Intraperitoneal Q24H  . febuxostat  40 mg Oral Daily  . furosemide  40 mg Oral Daily  . gentamicin cream  1 application Topical Daily  . heparin  5,000 Units Subcutaneous Q8H  . losartan  100 mg Oral Daily  . metoprolol tartrate  25 mg Oral BID  . pantoprazole  40 mg Oral Daily  . predniSONE  50 mg Oral Q breakfast  . sodium bicarbonate  650 mg Oral BID  . sodium chloride flush  3 mL Intravenous Q12H  . Vitamin D (Ergocalciferol)  50,000 Units Oral Weekly   sodium chloride, acetaminophen, albuterol, nitroGLYCERIN, ondansetron (ZOFRAN) IV,  oxyCODONE-acetaminophen, sodium chloride flush  Assessment/ Plan:  36 y.o. 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   Readmitted 1/18 for uremic pericarditis.    [facility]/PD  1. End-stage renal disease on  PD. 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  BUN/Cr quite high. -patient okay for discharge at this point in time.  I recommended to him that he perform peritoneal dialysis nightly.  For the next several nights we have suggested that he continue to use 4.25% dextrose solution.  In addition we offered him short-term transition to hemodialysis however he declined.  He will continue to perform 6 nightly exchanges.  2. Anemia of chronic kidney disease Patient administered Epogen 02/27/2016.  Continue to monitor hemoglobin As an outpatient.  3. Hyperkalemia:  Potassium down to 4.4 and acceptable.  Continue to monitor.  4. Shortness of breath/chest pain:  Suspect probable uremic pericarditis.  Treatment has been to increase peritoneal dialysis from 3 nights a week to every night.  We also offered him short-term transition to hemodialysis however the patient declined.  The pericarditis will likely take some time to resolve.  He will continue to follow with [facility] nephrology as an outpatient. LOS: 4 Vineta Carone 1/12/20181:18 PM

## 2016-03-01 NOTE — Progress Notes (Signed)
Patient given discharge teaching and paperwork regarding medications, diet, follow-up appointments and activity. Patient understanding verbalized. No complaints at this time. IV and telemetry discontinued prior to leaving. Skin assessment as previously charted and vitals are stable; on room air. Patient being discharged to home. Caregiver/family present during discharge teaching. No further needs by Care Management. Prescriptions given to patient.

## 2016-03-01 NOTE — Progress Notes (Signed)
Pt PD disconnected at 0900a. Alert, c/o of angina 10/10. Primary RN present and aware. Final UF 2621. Holley Raring, MD aware. Asked primary staff to obtain standing weight on pt.

## 2016-03-01 NOTE — Discharge Summary (Signed)
Louis Lee, is a 36 y.o. male  DOB Nov 05, 1980  MRN [id number].  Admission date:  02/26/2016  Admitting Physician  Saundra Shelling, MD  Discharge Date:  03/01/2016   Primary MD  [facility]  Recommendations for primary care physician for things to follow:   F/u  with [facility] nephrology as scheduled.   Admission Diagnosis  Unstable angina (HCC) [I20.0] SOB (shortness of breath) [R06.02] Essential hypertension [I10] Chest pain [R07.9] Chest pain, unspecified type [R07.9] Stage 5 chronic kidney disease on chronic dialysis ([place]) [N18.6, Z99.2]   Discharge Diagnosis  Unstable angina ([place]) [I20.0] SOB (shortness of breath) [R06.02] Essential hypertension [I10] Chest pain [R07.9] Chest pain, unspecified type [R07.9] Stage 5 chronic kidney disease on chronic dialysis ([place]) [N18.6, Z99.2]    Principal Problem:   Acute pericarditis Active Problems:   Chest pain   Hyperkalemia   Hypertension   Cardiomyopathy ([place])   Normocytic anemia      Past Medical History:  Diagnosis Date  . ESRD on dialysis ([place])   . Hypertension   . Renal disorder    pt is listed for kidney transplant    Past Surgical History:  Procedure Laterality Date  . GASTRIC BYPASS         History of present illness and  Hospital Course:     Kindly see H&P for history of present illness and admission details, please review complete Labs, Consult reports and Test reports for all details in brief     Hospital Course   36 year old obese male admitted because of fall left-sided chest pain. Admitted to telemetry, troponins negative for 2 times. Seen by cardiology because of his ESRD, chest pain. Thought to have uremic pericarditis, started on now IV Dilaudid, prednisone. Discharge home with by mouth Percocet, prednisone tapering dose.  Patient continued to request pain medication on a scheduled basis , may have a narcotic seeking behavior. #2 ESRD on peritoneal dialysis: Patient is on peritoneal dialysis 3 times a week: Seen by Anthonette Legato  from nephrology, recommended PD dialysis every day instead of 3 times a week. And we also offered hemodialysis but he refused. And patient to continue to perform 6 nightly exchanges as per nephrology recommendation. Obstructive sleep apnea, morbid obesity status post gastric stapling  63 in July 2009  3. acute pericarditis causing chest pain: Seen by cardiology.  For hypertension   #5 hyperkalemia improved with potassium  Shifting  Measures,, peritoneal dialysis.   Discharge Condition: stable   Follow UP   follow up with [facility]   Discharge Instructions  and  Discharge Medications   Continue peritoneal dialysis 3 times a week, needs to use 4.2 5 dextrose for the next several nights.     Allergies as of 03/01/2016   No Known Allergies     Medication List    TAKE these medications   albuterol 108 (90 Base) MCG/ACT inhaler Commonly known as:  PROVENTIL HFA;VENTOLIN HFA Inhale 2 puffs into the lungs every 6 (six) hours as needed.   amLODipine 5 MG tablet Commonly known as:  NORVASC Take 1 tablet by mouth daily.   aspirin 81 MG EC tablet Take 1 tablet (81 mg total) by mouth daily.   calcium acetate 667 MG capsule Commonly known as:  PHOSLO Take 1,334 mg by mouth 3 (three) times daily with meals. Take 2 tablets (1,337mg ) with each meal and between meals for total of 3 times per day.   febuxostat 40 MG tablet Commonly known as:  ULORIC Take 1 tablet by mouth daily.   furosemide 40 MG tablet Commonly known as:  LASIX Take 1 tablet by mouth daily.   losartan 100 MG tablet Commonly known as:  COZAAR Take 100 mg by mouth daily.   metoprolol tartrate 25 MG tablet Commonly known as:  LOPRESSOR Take 1 tablet (25 mg total) by mouth 2 (two) times daily.    oxyCODONE-acetaminophen 7.5-325 MG tablet Commonly known as:  PERCOCET Take 2 tablets by mouth every 6 (six) hours as needed for moderate pain or severe pain.   predniSONE 10 MG tablet Commonly known as:  DELTASONE Prednisone 50 mg daily for 2 days 40 mg daily for 2 days  30 mg daily for 2 days 10 mg daily for 2 days   sodium bicarbonate 650 MG tablet Take 650 mg by mouth 2 (two) times daily.   Vitamin D (Ergocalciferol) 50000 units Caps capsule Commonly known as:  DRISDOL Take 1 capsule by mouth once a week. wed         Diet and Activity recommendation: See Discharge Instructions above   Consults obtained - nephrology, cardiology.   Major procedures and Radiology Reports - PLEASE review detailed and final reports for all details, in brief -      Dg Chest Port 1 View  Result Date: 02/26/2016 CLINICAL DATA:  Acute onset of left-sided chest pain and nausea. Shortness of breath. Initial encounter. EXAM: PORTABLE CHEST 1 VIEW COMPARISON:  Chest radiograph performed 01/29/2016 FINDINGS: The lungs are mildly hypoexpanded. Vascular congestion is noted. Increased interstitial markings raise concern for pulmonary edema. No definite pleural effusion or pneumothorax is seen. The cardiomediastinal silhouette is mildly enlarged. No acute osseous abnormalities are seen. IMPRESSION: Lungs mildly hypoexpanded. Vascular congestion and mild cardiomegaly. Increased interstitial markings raise concern for pulmonary edema. Electronically Signed   By: Garald Balding M.D.   On: 02/26/2016 03:59    Micro Results     No results found for this or any previous visit (from the past 240 hour(s)).     Today   Subjective:   Louis Lee today Stable for discharge.  Objective:   Blood pressure 136/82, pulse 75, temperature 98.5 F (36.9 C), temperature source Oral, resp. rate 18, height 5\' 4"  (1.626 m), weight 98.1 kg (216 lb 3.2 oz), SpO2 94 %.   Intake/Output Summary (Last 24 hours) at  03/01/16 1406 Last data filed at 03/01/16 0900  Gross per 24 hour  Intake              240 ml  Output             3221 ml  Net            -2981 ml    Exam Awake Alert, Oriented x 3, No new F.N deficits, Normal affect [place].AT,PERRAL Supple Neck,No JVD, No cervical lymphadenopathy appriciated.  Symmetrical Chest wall movement, Good air movement bilaterally, CTAB RRR,No Gallops,Rubs or new Murmurs, No Parasternal Heave +ve B.Sounds, Abd Soft, Non tender, No organomegaly appriciated, No rebound -guarding or rigidity. No Cyanosis, Clubbing or edema, No new Rash or bruise  Data Review   CBC w Diff: Lab Results  Component Value Date   WBC 7.5 02/26/2016   HGB 7.5 (L) 02/26/2016   HGB 11.8 (L) 06/09/2012   HCT 22.8 (L) 02/26/2016   HCT 35.7 (L) 06/09/2012   PLT 275 02/26/2016   PLT 239 06/09/2012   LYMPHOPCT 14.4 06/09/2012   MONOPCT 6.5 06/09/2012   EOSPCT 7.0 06/09/2012  BASOPCT 0.4 06/09/2012    CMP: Lab Results  Component Value Date   NA 139 03/01/2016   NA 139 06/09/2012   K 4.4 03/01/2016   K 4.7 06/09/2012   CL 98 (L) 03/01/2016   CL 114 (H) 06/09/2012   CO2 21 (L) 03/01/2016   CO2 19 (L) 06/09/2012   BUN 108 (H) 03/01/2016   BUN 33 (H) 06/09/2012   CREATININE 12.60 (H) 03/01/2016   CREATININE 2.34 (H) 06/09/2012   PROT 7.5 02/26/2016   PROT 7.6 06/09/2012   ALBUMIN 2.6 (L) 02/26/2016   ALBUMIN 2.7 (L) 06/09/2012   BILITOT 1.3 (H) 02/26/2016   BILITOT 0.3 06/09/2012   ALKPHOS 61 02/26/2016   ALKPHOS 281 (H) 06/09/2012   AST 18 02/26/2016   AST 20 06/09/2012   ALT 12 (L) 02/26/2016   ALT 20 06/09/2012  .   Total Time in preparing paper work, data evaluation and todays exam - 73 minutes  Acey Woodfield M.D on 03/01/2016 at 2:06 PM    Note: This dictation was prepared with Dragon dictation along with smaller phrase technology. Any transcriptional errors that result from this process are unintentional.

## 2016-03-01 NOTE — Progress Notes (Signed)
End of PD

## 2016-03-01 NOTE — Care Management (Signed)
Notified Cheryl with [employer] of discharge and faxed the DC summary

## 2017-12-12 IMAGING — CR DG CHEST 2V
1 series · 2 of 2 positions shown · non-contrast
Comparison: Chest x-ray 01/11/2009.

CLINICAL DATA: 34-year-old male with chest pressure and pain,
diaphoresis, nausea, lightheadedness and dizziness.

EXAM:
CHEST  2 VIEW

[Series 1: dg chest 2 view · 0.14mm/px · 2 of 2 slices shown]
[im 1/2]
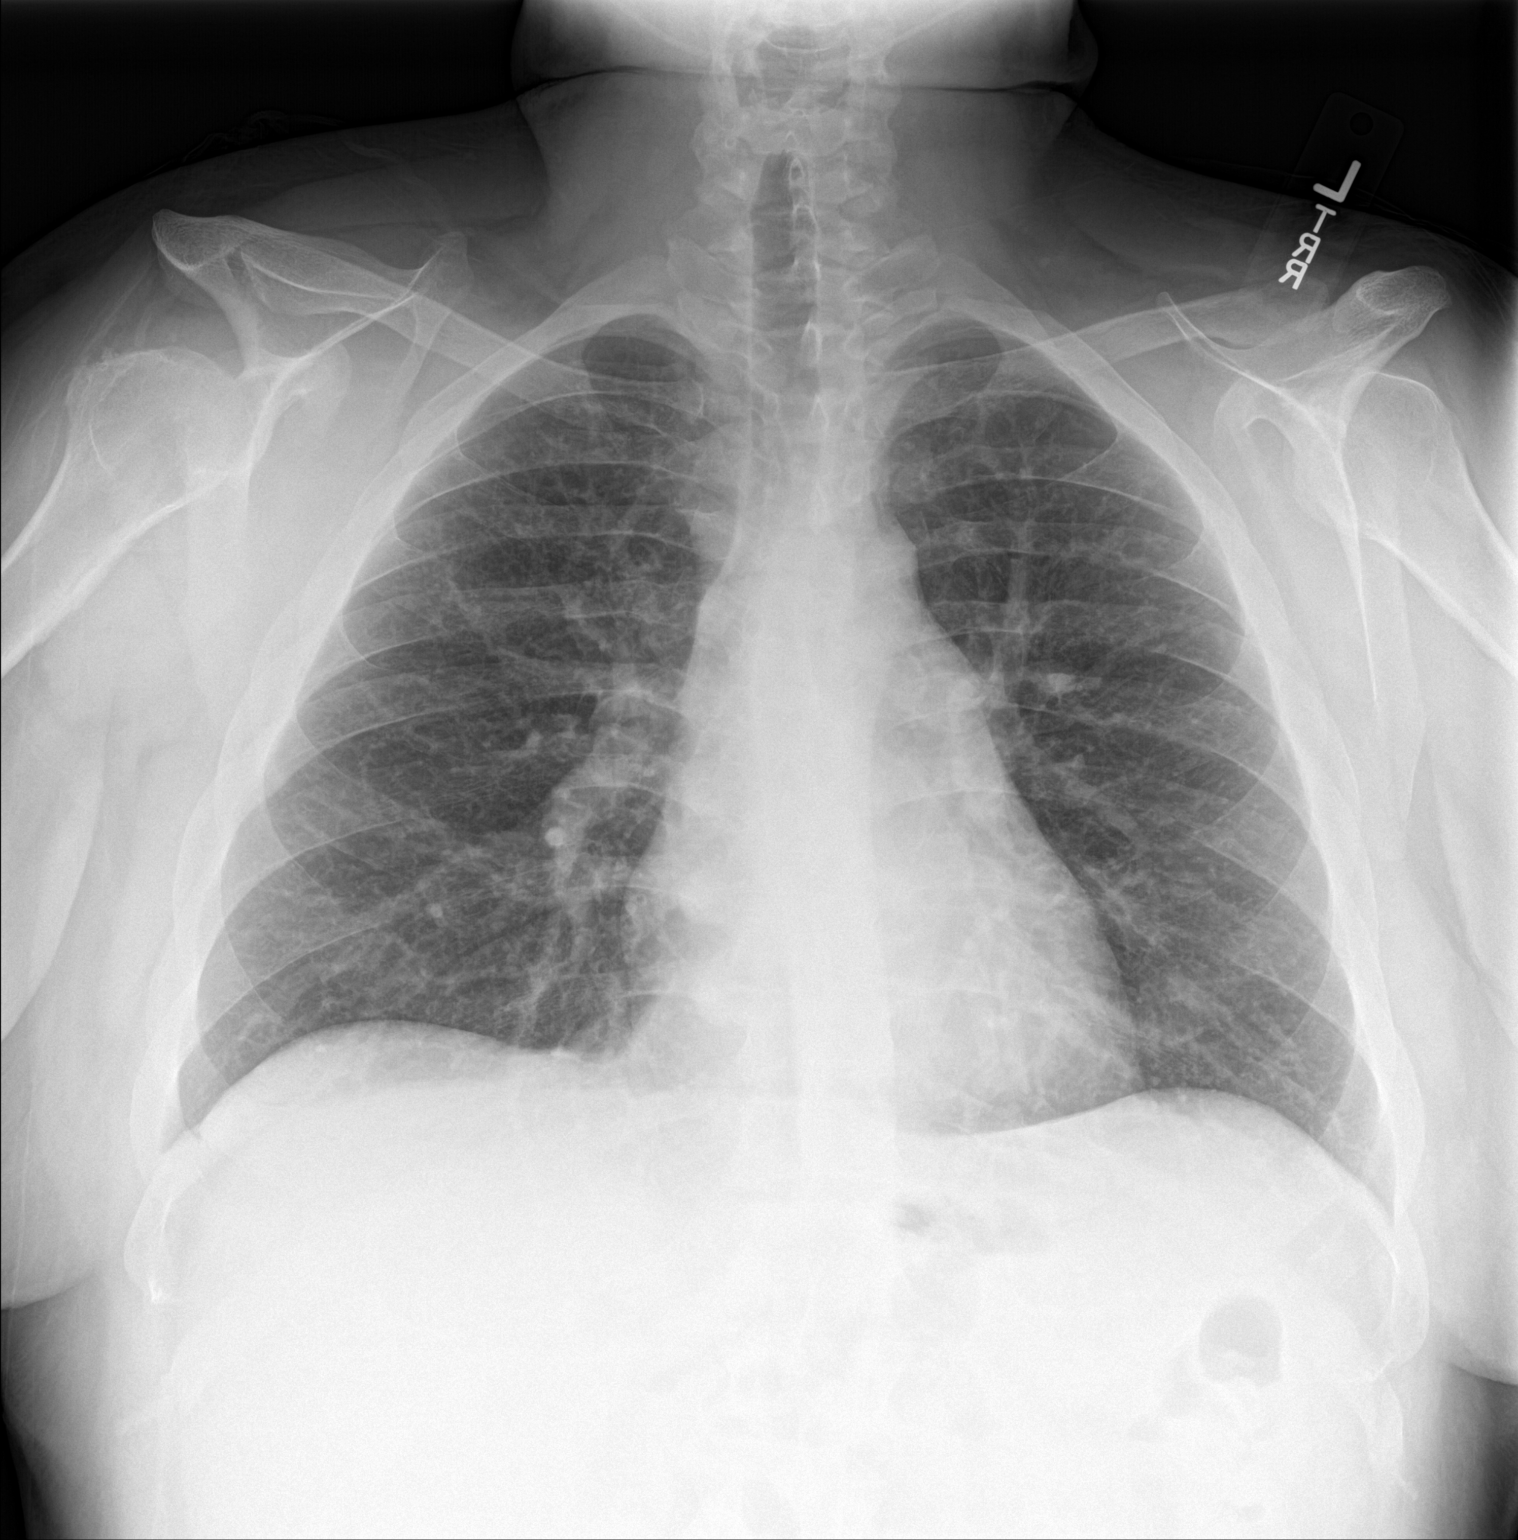
[im 2/2]
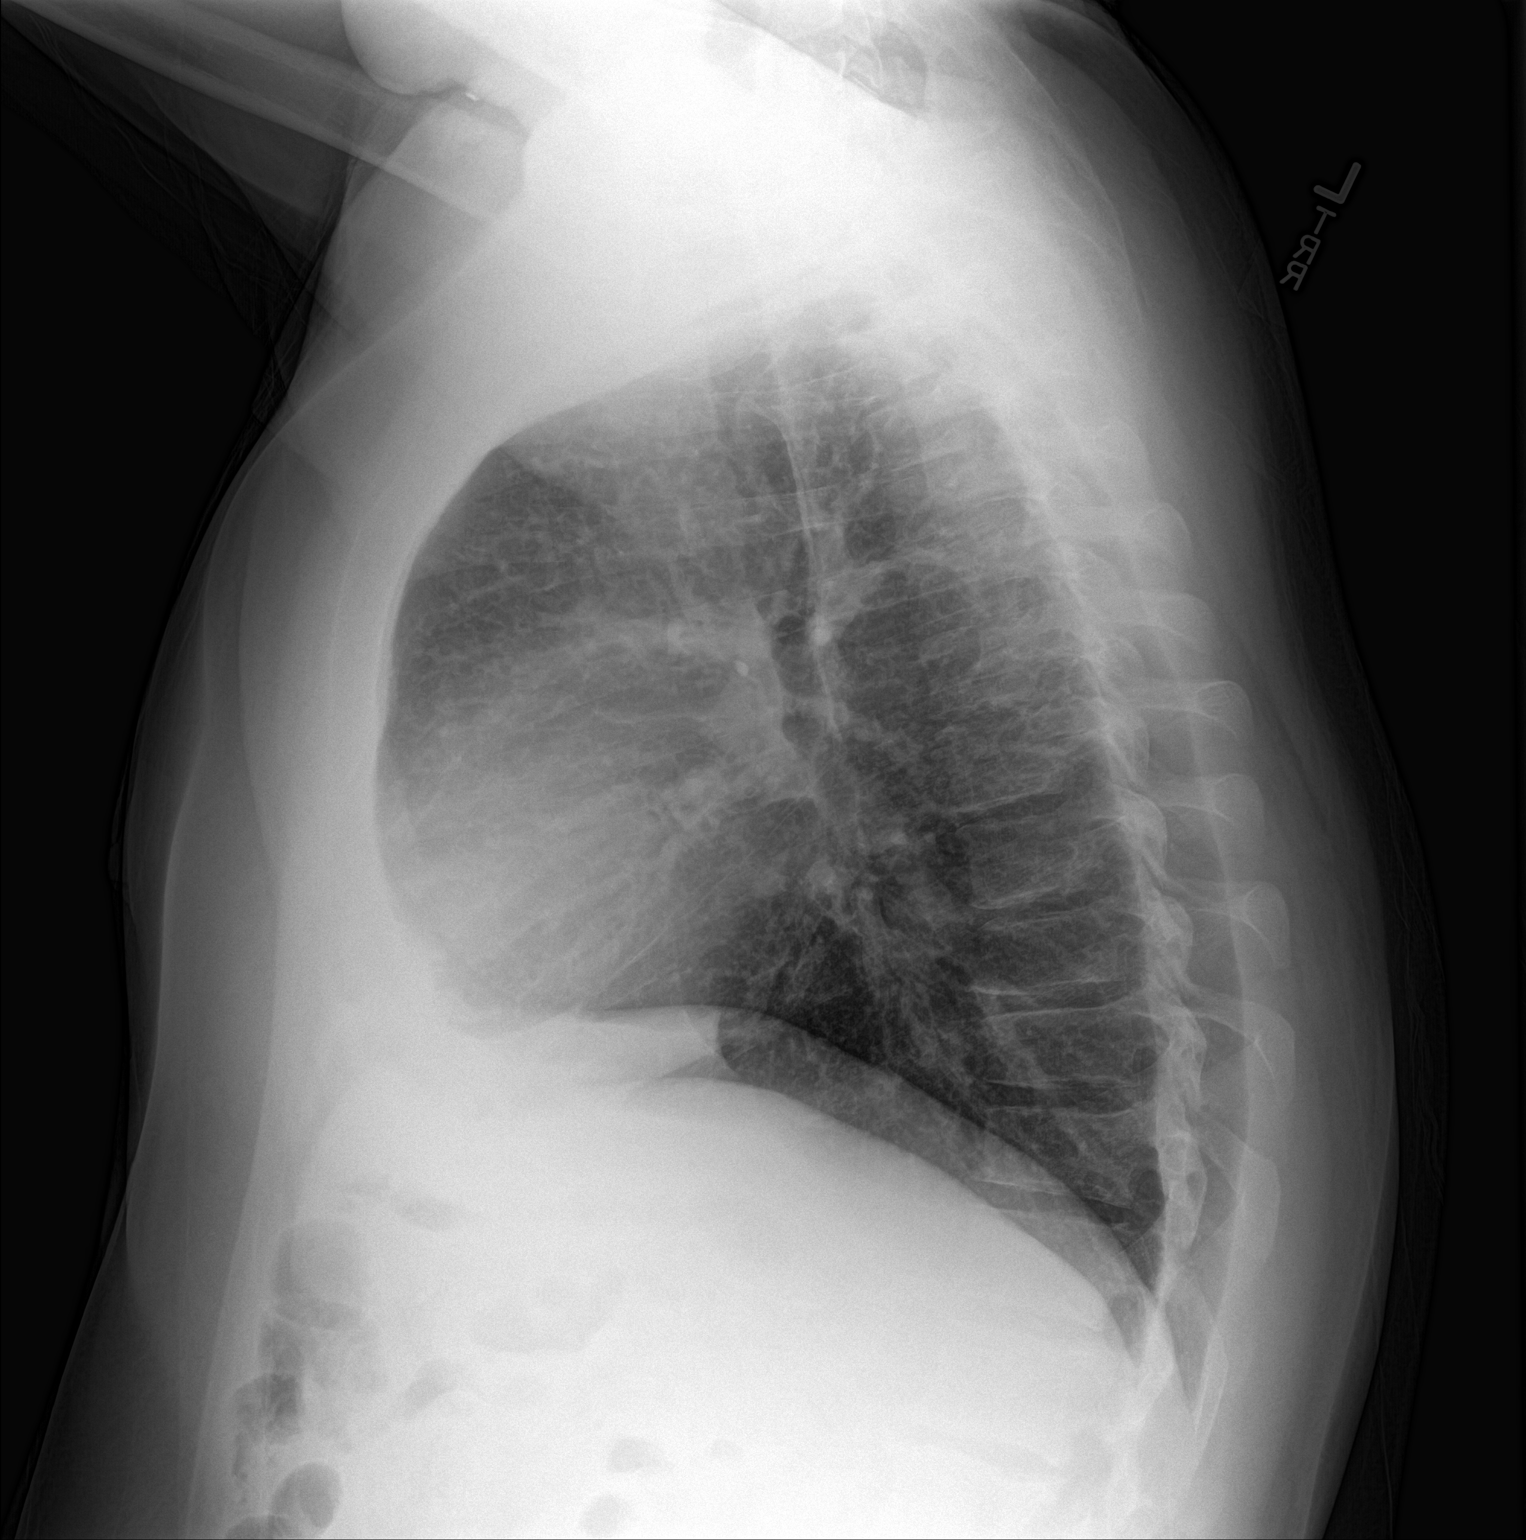

[2 of 2 positions shown; findings below may reference images not displayed]

FINDINGS: Mild diffuse peribronchial cuffing. Lung volumes are normal. No
consolidative airspace disease. No pleural effusions. No
pneumothorax. No pulmonary nodule or mass noted. Pulmonary
vasculature and the cardiomediastinal silhouette are within normal
limits.
IMPRESSION: 1. Mild diffuse peribronchial cuffing, which could be seen in the
setting of an acute or chronic bronchitis.

## 2018-11-04 IMAGING — CR DG CHEST 2V
1 series · 2 of 2 positions shown · non-contrast
Comparison: 03/08/2015

CLINICAL DATA: Cough and chills for 2 days

EXAM:
CHEST  2 VIEW

[Series 1: dg chest 2 view · 0.14mm/px · 2 of 2 slices shown]
[im 1/2]
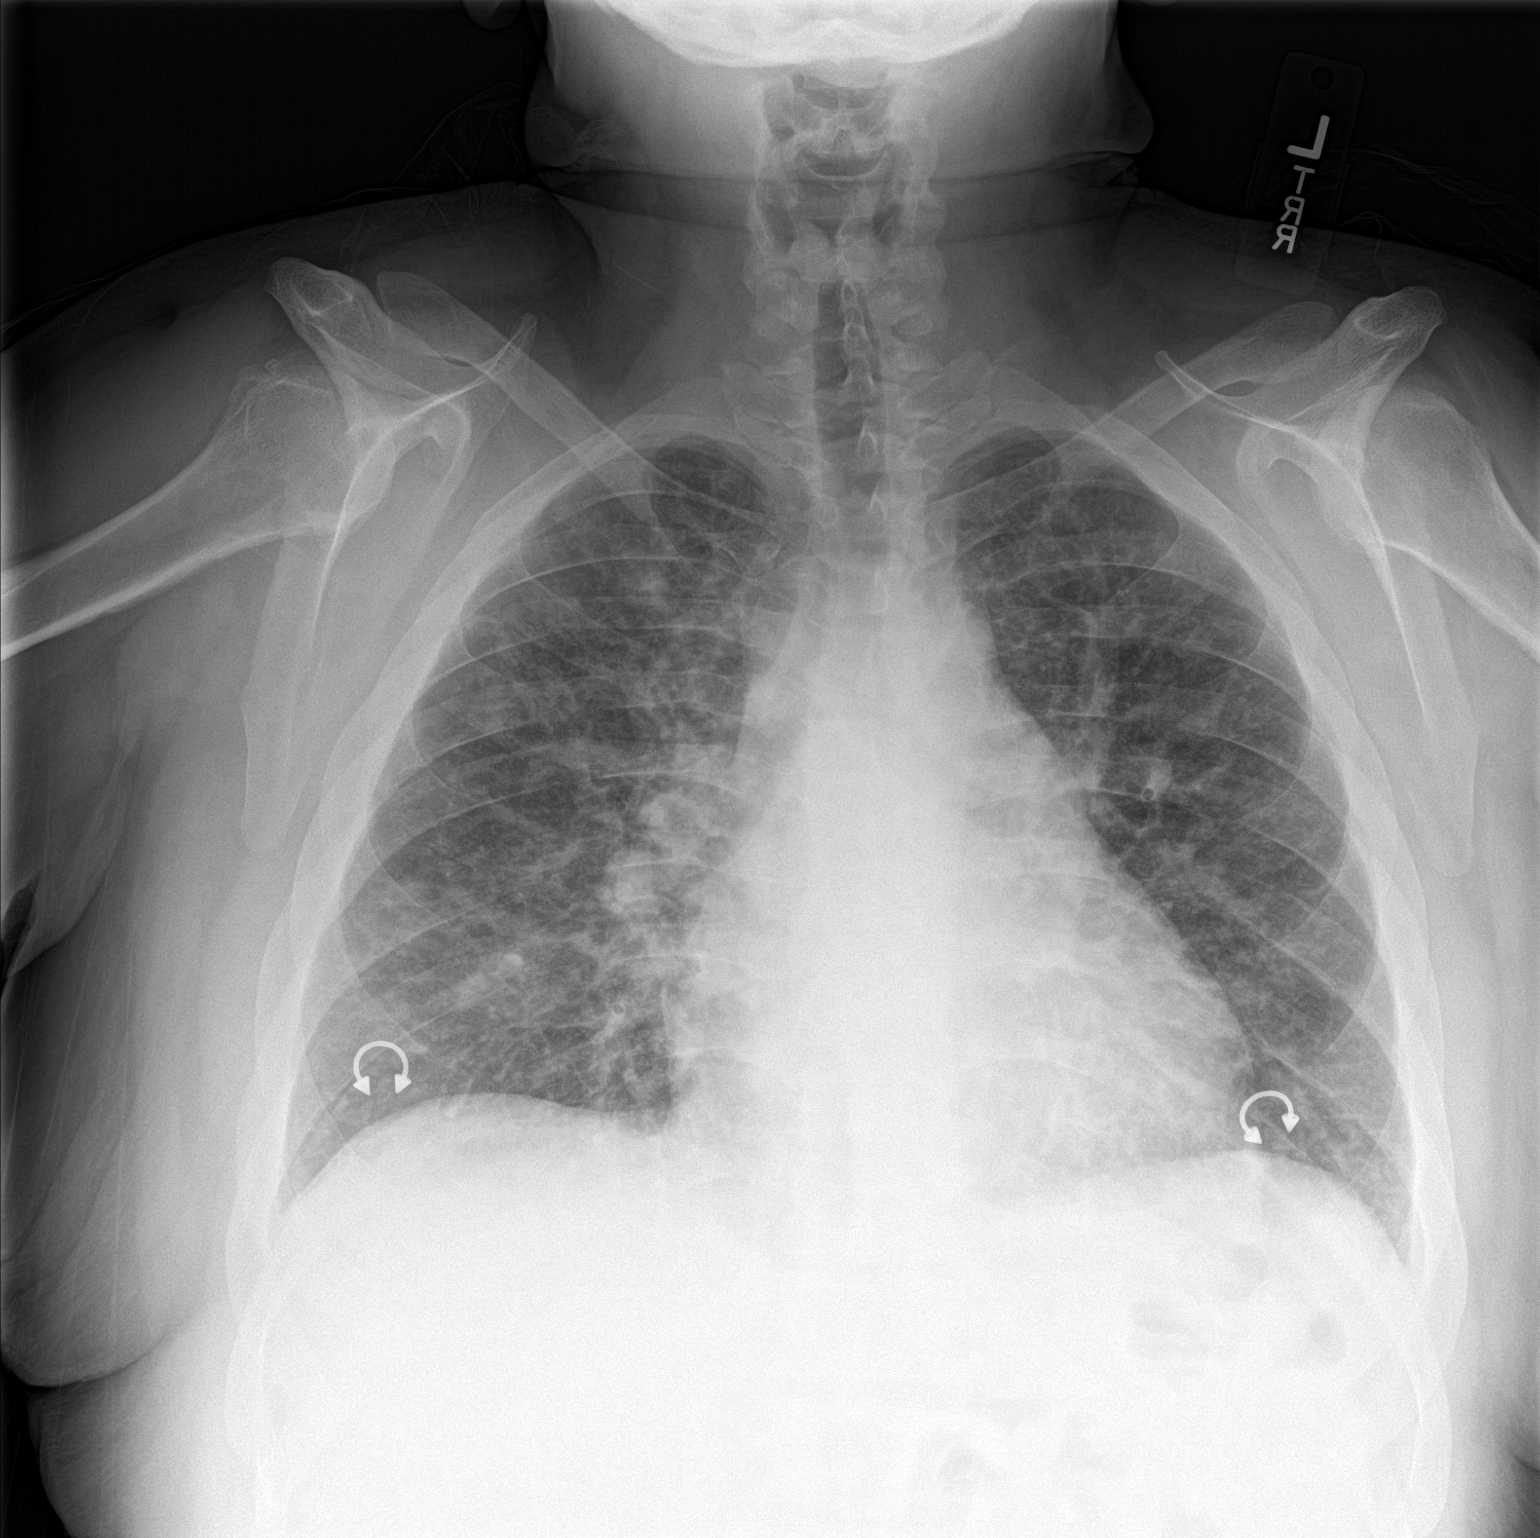
[im 2/2]
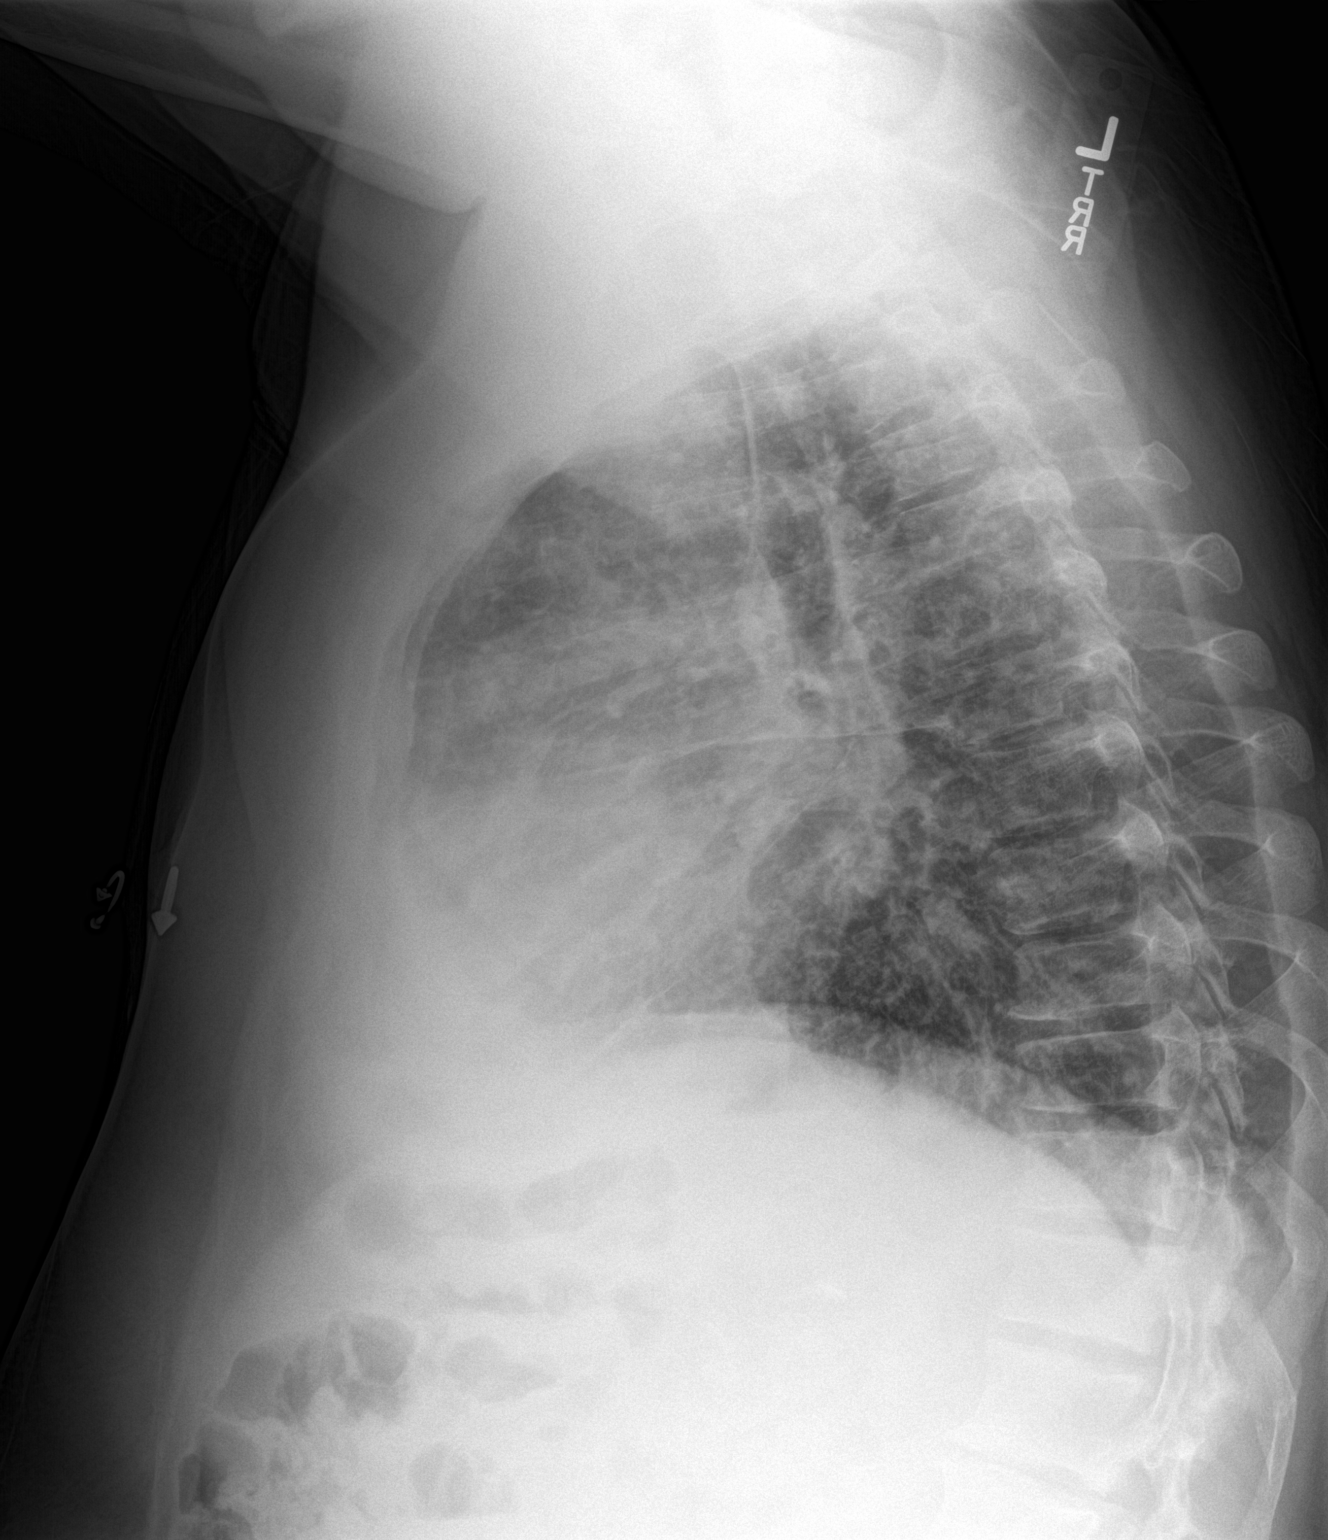

[2 of 2 positions shown; findings below may reference images not displayed]

FINDINGS: Cardiac shadow is at the upper limits of normal in size. The lungs
are well aerated bilaterally and demonstrate some patchy
infiltrative changes bilaterally. No sizable effusion is seen. No
acute bony abnormality is noted.
IMPRESSION: Patchy infiltrates bilaterally slightly worse on the right than the
left.

## 2018-12-02 IMAGING — DX DG CHEST 1V PORT
1 series · 1 of 1 positions shown · non-contrast
Comparison: Chest radiograph performed 01/29/2016

CLINICAL DATA: Acute onset of left-sided chest pain and nausea.
Shortness of breath. Initial encounter.

EXAM:
PORTABLE CHEST 1 VIEW

[chest ap]
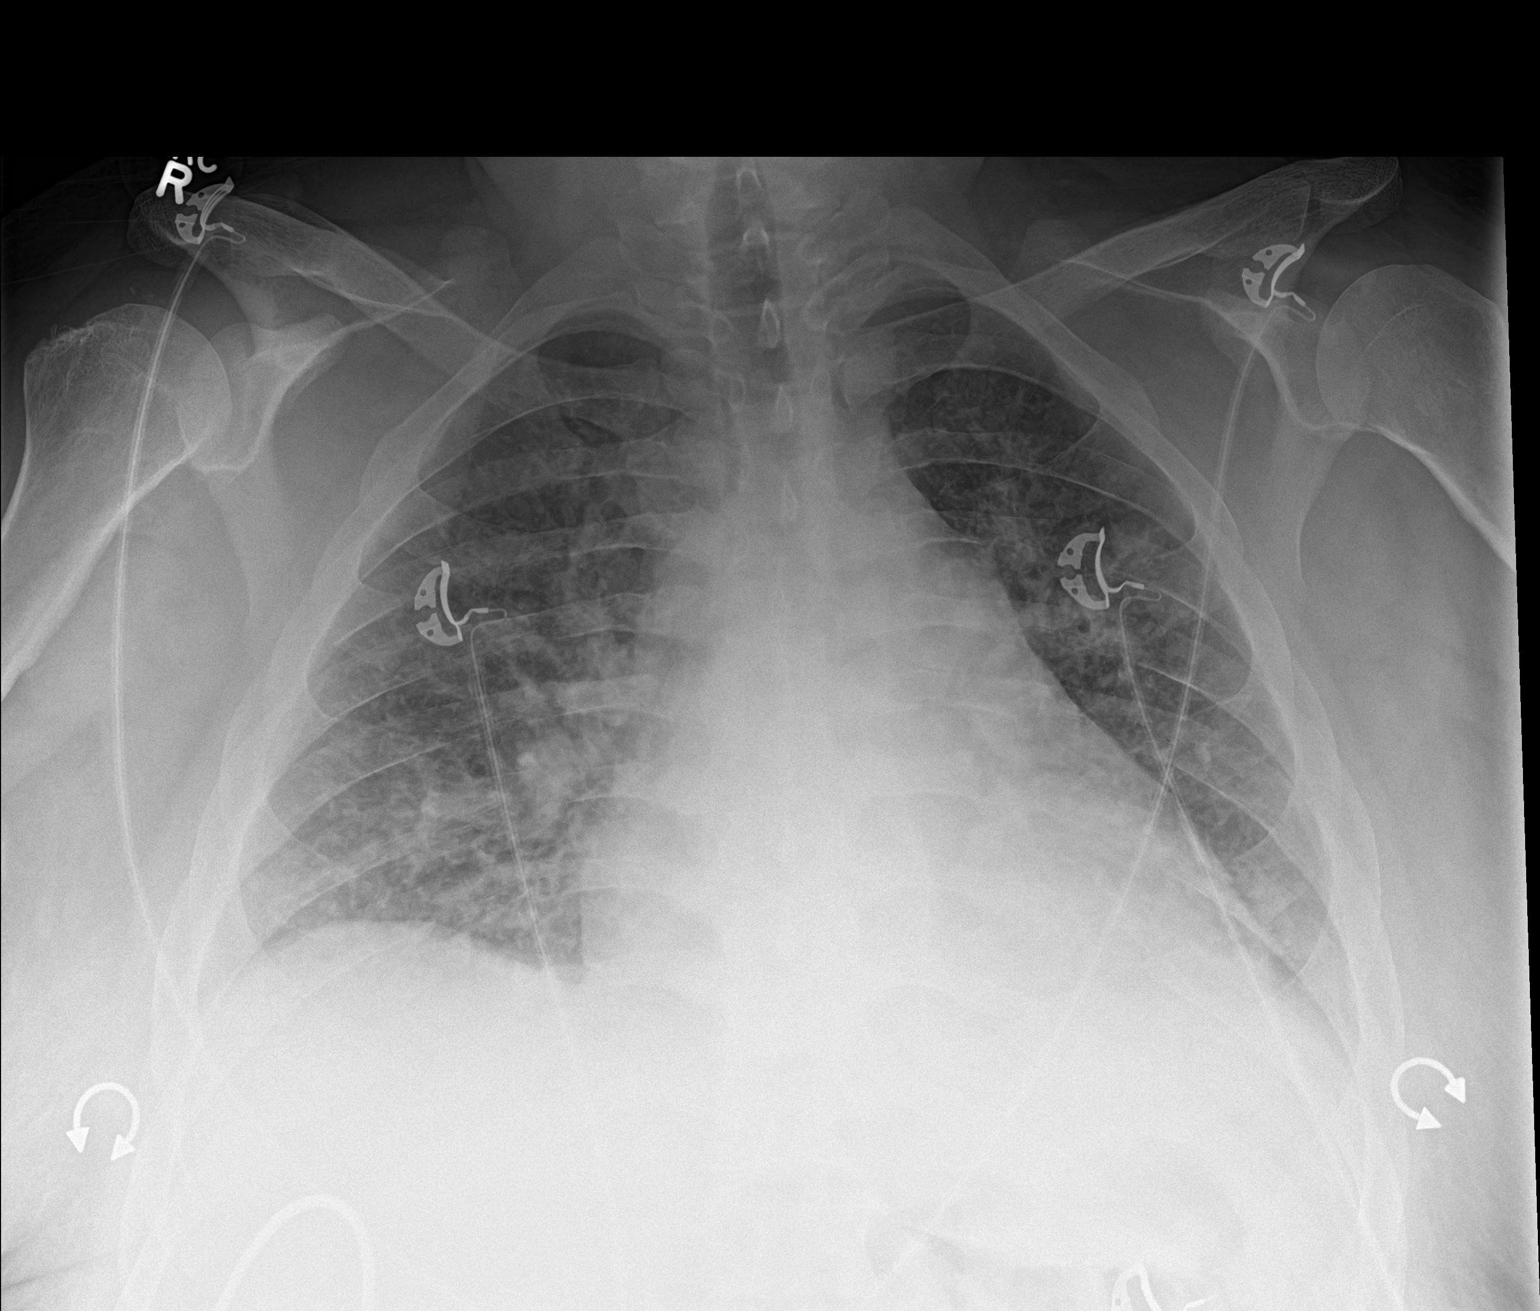

[1 of 1 positions shown; findings below may reference images not displayed]

FINDINGS: The lungs are mildly hypoexpanded. Vascular congestion is noted.
Increased interstitial markings raise concern for pulmonary edema.
No definite pleural effusion or pneumothorax is seen.

The cardiomediastinal silhouette is mildly enlarged. No acute
osseous abnormalities are seen.
IMPRESSION: Lungs mildly hypoexpanded. Vascular congestion and mild
cardiomegaly. Increased interstitial markings raise concern for
pulmonary edema.

## 2019-10-26 ENCOUNTER — Other Ambulatory Visit: Payer: Self-pay | Admitting: Neurosurgery

## 2019-10-26 DIAGNOSIS — M5441 Lumbago with sciatica, right side: Secondary | ICD-10-CM

## 2021-02-26 DIAGNOSIS — Z4822 Encounter for aftercare following kidney transplant: Secondary | ICD-10-CM | POA: Diagnosis not present

## 2021-02-26 DIAGNOSIS — Z7964 Long term (current) use of myelosuppressive agent: Secondary | ICD-10-CM | POA: Diagnosis not present

## 2021-02-26 DIAGNOSIS — D631 Anemia in chronic kidney disease: Secondary | ICD-10-CM | POA: Diagnosis not present

## 2021-02-26 DIAGNOSIS — E872 Acidosis, unspecified: Secondary | ICD-10-CM | POA: Diagnosis not present

## 2021-02-26 DIAGNOSIS — Z79620 Long term (current) use of immunosuppressive biologic: Secondary | ICD-10-CM | POA: Diagnosis not present

## 2021-02-26 DIAGNOSIS — I129 Hypertensive chronic kidney disease with stage 1 through stage 4 chronic kidney disease, or unspecified chronic kidney disease: Secondary | ICD-10-CM | POA: Diagnosis not present

## 2021-02-26 DIAGNOSIS — N183 Chronic kidney disease, stage 3 unspecified: Secondary | ICD-10-CM | POA: Diagnosis not present

## 2021-02-26 DIAGNOSIS — Z7982 Long term (current) use of aspirin: Secondary | ICD-10-CM | POA: Diagnosis not present

## 2021-02-26 DIAGNOSIS — Z7952 Long term (current) use of systemic steroids: Secondary | ICD-10-CM | POA: Diagnosis not present

## 2021-02-26 DIAGNOSIS — N2581 Secondary hyperparathyroidism of renal origin: Secondary | ICD-10-CM | POA: Diagnosis not present

## 2021-02-26 DIAGNOSIS — N186 End stage renal disease: Secondary | ICD-10-CM | POA: Diagnosis not present

## 2021-02-26 DIAGNOSIS — Z79621 Long term (current) use of calcineurin inhibitor: Secondary | ICD-10-CM | POA: Diagnosis not present

## 2021-02-26 DIAGNOSIS — Z940 Kidney transplant status: Secondary | ICD-10-CM | POA: Diagnosis not present

## 2021-02-26 DIAGNOSIS — N1832 Chronic kidney disease, stage 3b: Secondary | ICD-10-CM | POA: Diagnosis not present

## 2021-02-26 DIAGNOSIS — R600 Localized edema: Secondary | ICD-10-CM | POA: Diagnosis not present

## 2022-10-20 ENCOUNTER — Other Ambulatory Visit: Payer: Self-pay

## 2022-10-20 ENCOUNTER — Emergency Department
Admission: EM | Admit: 2022-10-20 | Payer: Medicare Other | Source: Home / Self Care | Attending: Emergency Medicine | Admitting: Emergency Medicine

## 2022-10-20 ENCOUNTER — Encounter: Payer: Self-pay | Admitting: Intensive Care

## 2022-10-20 DIAGNOSIS — D638 Anemia in other chronic diseases classified elsewhere: Secondary | ICD-10-CM

## 2022-10-20 DIAGNOSIS — R799 Abnormal finding of blood chemistry, unspecified: Secondary | ICD-10-CM | POA: Diagnosis present

## 2022-10-20 DIAGNOSIS — D649 Anemia, unspecified: Secondary | ICD-10-CM | POA: Diagnosis not present

## 2022-10-20 DIAGNOSIS — N189 Chronic kidney disease, unspecified: Secondary | ICD-10-CM | POA: Diagnosis not present

## 2022-10-20 DIAGNOSIS — D631 Anemia in chronic kidney disease: Secondary | ICD-10-CM | POA: Diagnosis not present

## 2022-10-20 LAB — CBC WITH DIFFERENTIAL/PLATELET
Abs Immature Granulocytes: 0.04 10*3/uL (ref 0.00–0.07)
Basophils Absolute: 0 10*3/uL (ref 0.0–0.1)
Basophils Relative: 0 %
Eosinophils Absolute: 0.1 10*3/uL (ref 0.0–0.5)
Eosinophils Relative: 1 %
HCT: 19.7 % — ABNORMAL LOW (ref 39.0–52.0)
Hemoglobin: 5.6 g/dL — ABNORMAL LOW (ref 13.0–17.0)
Immature Granulocytes: 1 %
Lymphocytes Relative: 5 %
Lymphs Abs: 0.4 10*3/uL — ABNORMAL LOW (ref 0.7–4.0)
MCH: 30.1 pg (ref 26.0–34.0)
MCHC: 28.4 g/dL — ABNORMAL LOW (ref 30.0–36.0)
MCV: 105.9 fL — ABNORMAL HIGH (ref 80.0–100.0)
Monocytes Absolute: 0.4 10*3/uL (ref 0.1–1.0)
Monocytes Relative: 5 %
Neutro Abs: 6.5 10*3/uL (ref 1.7–7.7)
Neutrophils Relative %: 88 %
Platelets: 260 10*3/uL (ref 150–400)
RBC: 1.86 MIL/uL — ABNORMAL LOW (ref 4.22–5.81)
RDW: 16.6 % — ABNORMAL HIGH (ref 11.5–15.5)
WBC: 7.4 10*3/uL (ref 4.0–10.5)
nRBC: 0 % (ref 0.0–0.2)

## 2022-10-20 LAB — BASIC METABOLIC PANEL
Anion gap: 15 (ref 5–15)
BUN: 45 mg/dL — ABNORMAL HIGH (ref 6–20)
CO2: 21 mmol/L — ABNORMAL LOW (ref 22–32)
Calcium: 9.8 mg/dL (ref 8.9–10.3)
Chloride: 103 mmol/L (ref 98–111)
Creatinine, Ser: 5.44 mg/dL — ABNORMAL HIGH (ref 0.61–1.24)
GFR, Estimated: 13 mL/min — ABNORMAL LOW (ref >60)
Glucose, Bld: 141 mg/dL — ABNORMAL HIGH (ref 70–99)
Potassium: 4 mmol/L (ref 3.5–5.1)
Sodium: 139 mmol/L (ref 135–145)

## 2022-10-20 LAB — ABO/RH: ABO/RH(D): A POS

## 2022-10-20 LAB — PREPARE RBC (CROSSMATCH)

## 2022-10-20 MED ORDER — SODIUM CHLORIDE 0.9 % IV SOLN: 10.0000 mL/h | Freq: Once | INTRAVENOUS | Status: DC

## 2022-10-20 NOTE — ED Provider Notes (Signed)
[facility] Provider Note    Event Date/Time   First MD Initiated Contact with Patient 10/20/22 1817     (approximate)   History   Abnormal Lab   HPI  Louis Lee is a 42 y.o. male with a history of CKD and chronic anemia who presents referred by his outpatient physician for a hemoglobin of 6; he was recommended to come to the ED for transfusion.  The patient denies any weakness or lightheadedness, chest pain, difficulty breathing, or other acute symptoms.  He states that he has had similar low hemoglobin levels before and has been transfused multiple times before.  I reviewed the past medical records; the patient was most recently evaluated by nephrology this week and called on 8/30 to inform him of the low hemoglobin and need for transfusion.   Physical Exam   Triage Vital Signs: ED Triage Vitals  Encounter Vitals Group     BP 10/20/22 1817 (!) 145/81     Systolic BP Percentile --      Diastolic BP Percentile --      Pulse Rate 10/20/22 1817 (!) 106     Resp 10/20/22 1817 20     Temp 10/20/22 1817 99 F (37.2 C)     Temp Source 10/20/22 1817 Oral     SpO2 10/20/22 1817 99 %     Weight 10/20/22 1811 210 lb (95.3 kg)     Height 10/20/22 1811 5\' 3"  (1.6 m)     Head Circumference --      Peak Flow --      Pain Score 10/20/22 1811 0     Pain Loc --      Pain Education --      Exclude from Growth Chart --     Most recent vital signs: Vitals:   10/20/22 2309 10/20/22 2325  BP: 114/80   Pulse: 77 80  Resp: 20 17  Temp: 98 F (36.7 C) 98.2 F (36.8 C)  SpO2: 100% 100%     General: Awake, no distress.  CV:  Good peripheral perfusion.  Resp:  Normal effort.  Abd:  No distention.  Other:  Pale appearing.   ED Results / Procedures / Treatments   Labs (all labs ordered are listed, but only abnormal results are displayed) Labs Reviewed  CBC WITH DIFFERENTIAL/PLATELET - Abnormal; Notable for the following components:      Result  Value   RBC 1.86 (*)    Hemoglobin 5.6 (*)    HCT 19.7 (*)    MCV 105.9 (*)    MCHC 28.4 (*)    RDW 16.6 (*)    Lymphs Abs 0.4 (*)    All other components within normal limits  BASIC METABOLIC PANEL - Abnormal; Notable for the following components:   CO2 21 (*)    Glucose, Bld 141 (*)    BUN 45 (*)    Creatinine, Ser 5.44 (*)    GFR, Estimated 13 (*)    All other components within normal limits  TYPE AND SCREEN  TYPE AND SCREEN  PREPARE RBC (CROSSMATCH)  ABO/RH     EKG   RADIOLOGY   PROCEDURES:  Critical Care performed: No  Procedures   MEDICATIONS ORDERED IN ED: Medications  0.9 %  sodium chloride infusion (has no administration in time range)     IMPRESSION / MDM / ASSESSMENT AND PLAN / ED COURSE  I reviewed the triage vital signs and the nursing notes.  42 year old  male with PMH as noted above including CKD and chronic anemia presents with worsened anemia, recommended for transfusion by his outpatient provider.  The patient denies any acute symptoms although appears quite pale on exam.  His vital signs are stable.  Differential diagnosis includes, but is not limited to, anemia of chronic disease, CKD.  There is no evidence of iron deficiency or acute blood loss.  Hemoglobin today is 5.6.  Creatinine is elevated which is baseline for the patient.  I have ordered 2 units of PRBCs to be transfused.  Patient's presentation is most consistent with acute presentation with potential threat to life or bodily function.  The patient is on the cardiac monitor to evaluate for evidence of arrhythmia and/or significant heart rate changes.  ----------------------------------------- 11:33 PM on 10/20/2022 -----------------------------------------  The patient has received the first unit of blood and is now receiving the second.  He will be stable for discharge home once this is completed.   FINAL CLINICAL IMPRESSION(S) / ED DIAGNOSES   Final diagnoses:  Anemia of  chronic disease     Rx / DC Orders   ED Discharge Orders     None        Note:  This document was prepared using Dragon voice recognition software and may include unintentional dictation errors.    Dionne Bucy, MD 10/20/22 2333

## 2022-10-20 NOTE — Discharge Instructions (Signed)
Follow-up with your primary care provider and nephrologist as scheduled.  Return to the ER for any new or worsening symptoms that concerning.

## 2022-10-20 NOTE — ED Notes (Signed)
Pt resting comfortably in bed. Family at bedside. Awaiting MD

## 2022-10-20 NOTE — ED Triage Notes (Signed)
Patient sent by PCP for low hemoglobin levels. Reports he has received blood transfusions in the past. History kidney transplant. Denies pain. Denies any symptoms

## 2022-10-21 LAB — BPAM RBC
Blood Product Expiration Date: 202409292359
Blood Product Expiration Date: 202409292359
ISSUE DATE / TIME: 202409012050
ISSUE DATE / TIME: 202409012246
Unit Type and Rh: 6200
Unit Type and Rh: 6200

## 2022-10-21 LAB — TYPE AND SCREEN
ABO/RH(D): A POS
Antibody Screen: NEGATIVE
Unit division: 0
Unit division: 0

## 2022-10-21 NOTE — ED Provider Notes (Signed)
Patient resting comfortably completely asymptomatic.  Has plan in place already follow-up with his physician  Return precautions and treatment recommendations and follow-up discussed with the patient who is agreeable with the plan.    Sharyn Creamer, MD 10/21/22 [phone]
